# Patient Record
Sex: Female | Born: 1950
Health system: Southern US, Community
[De-identification: ages and names within clinical notes are randomized; demographics above are authoritative.]

## PROBLEM LIST (undated history)

## (undated) DIAGNOSIS — E079 Disorder of thyroid, unspecified: Secondary | ICD-10-CM

## (undated) DIAGNOSIS — M8580 Other specified disorders of bone density and structure, unspecified site: Secondary | ICD-10-CM

## (undated) DIAGNOSIS — E039 Hypothyroidism, unspecified: Secondary | ICD-10-CM

## (undated) DIAGNOSIS — K5790 Diverticulosis of intestine, part unspecified, without perforation or abscess without bleeding: Secondary | ICD-10-CM

## (undated) DIAGNOSIS — T7840XA Allergy, unspecified, initial encounter: Secondary | ICD-10-CM

## (undated) DIAGNOSIS — Q438 Other specified congenital malformations of intestine: Secondary | ICD-10-CM

## (undated) DIAGNOSIS — I471 Supraventricular tachycardia, unspecified: Secondary | ICD-10-CM

## (undated) DIAGNOSIS — K760 Fatty (change of) liver, not elsewhere classified: Secondary | ICD-10-CM

## (undated) HISTORY — DX: Allergy, unspecified, initial encounter: T78.40XA

## (undated) HISTORY — DX: Disorder of thyroid, unspecified: E07.9

## (undated) HISTORY — PX: TUBAL LIGATION: SHX77

## (undated) HISTORY — PX: COLONOSCOPY: SHX174

---

## 2004-10-12 ENCOUNTER — Ambulatory Visit: Payer: Self-pay | Admitting: Unknown Physician Specialty

## 2005-04-27 ENCOUNTER — Ambulatory Visit: Payer: Self-pay | Admitting: Unknown Physician Specialty

## 2005-08-27 ENCOUNTER — Ambulatory Visit: Payer: Self-pay | Admitting: Gastroenterology

## 2005-10-18 ENCOUNTER — Ambulatory Visit: Payer: Self-pay | Admitting: Unknown Physician Specialty

## 2006-02-18 ENCOUNTER — Ambulatory Visit: Payer: Self-pay | Admitting: Unknown Physician Specialty

## 2006-10-21 ENCOUNTER — Ambulatory Visit: Payer: Self-pay | Admitting: Unknown Physician Specialty

## 2007-10-23 ENCOUNTER — Ambulatory Visit: Payer: Self-pay | Admitting: Unknown Physician Specialty

## 2008-10-24 ENCOUNTER — Ambulatory Visit: Payer: Self-pay | Admitting: Unknown Physician Specialty

## 2009-10-28 ENCOUNTER — Ambulatory Visit: Payer: Self-pay | Admitting: Unknown Physician Specialty

## 2010-03-13 ENCOUNTER — Ambulatory Visit: Payer: Self-pay

## 2010-06-22 ENCOUNTER — Ambulatory Visit: Payer: Self-pay | Admitting: General Practice

## 2010-12-29 ENCOUNTER — Ambulatory Visit: Payer: Self-pay | Admitting: Unknown Physician Specialty

## 2011-06-10 LAB — HM PAP SMEAR: HM Pap smear: NORMAL

## 2011-11-08 LAB — HM MAMMOGRAPHY: HM Mammogram: NORMAL

## 2012-01-11 ENCOUNTER — Ambulatory Visit: Payer: Self-pay | Admitting: Unknown Physician Specialty

## 2012-02-07 ENCOUNTER — Encounter: Payer: Self-pay | Admitting: Internal Medicine

## 2012-02-07 ENCOUNTER — Ambulatory Visit (INDEPENDENT_AMBULATORY_CARE_PROVIDER_SITE_OTHER): Payer: BC Managed Care – PPO | Admitting: Internal Medicine

## 2012-02-07 VITALS — BP 108/68 | HR 74 | Temp 97.9°F | Resp 16 | Ht 68.0 in | Wt 185.0 lb

## 2012-02-07 DIAGNOSIS — R0982 Postnasal drip: Secondary | ICD-10-CM | POA: Insufficient documentation

## 2012-02-07 DIAGNOSIS — R5381 Other malaise: Secondary | ICD-10-CM

## 2012-02-07 DIAGNOSIS — R5383 Other fatigue: Secondary | ICD-10-CM

## 2012-02-07 LAB — TSH: TSH: 4.06 u[IU]/mL (ref 0.35–5.50)

## 2012-02-07 LAB — T4, FREE: Free T4: 0.84 ng/dL (ref 0.60–1.60)

## 2012-02-07 MED ORDER — IPRATROPIUM BROMIDE 0.03 % NA SOLN: 2.0000 | Freq: Two times a day (BID) | NASAL | Status: DC

## 2012-02-07 NOTE — Progress Notes (Signed)
Patient ID: Joanne Taylor, female   DOB: [birth year], 61 y.o.   MRN: [id number] Patient Active Problem List  Diagnosis  . Post-nasal drip    Subjective:  CC:   Chief Complaint  Patient presents with  . New Patient    HPI:   Joanne Taylor is a 61 y.o. female who presents as a new patient to establish primary care with the chief complaint of need to establish care.  Referred by mother Romero Belling.  Was seeing Junious Dresser Kincius and Francia Greaves, .  History of torn meniscus left knee s/p Hooten repair 2011. No history of abnormal PAPs .  Last one Nov 2012 done every 2 ro 4 years, mammogrma normal in May [facility].  Sinus surgery, Erline Hau 2007.   Current cc is sinus drainage  And PND causes periodic pharyngitis and cough , worse at night.  cyclic recurrence. No allergic symptoms.  Right side recurrently a problem.  No abx since January .  No purulent drainage.  currently no pain.  Menopause in early 21s.Marland Kitchen  DEXA scan last 2 years.  Exercises for 30 minutes in pool daly .    History reviewed. No pertinent past medical history.  Past Surgical History  Procedure Date  . Tubal ligation     Family History  Problem Relation Age of Onset  . Hypertension Mother   . Mental illness Mother     depressive disorder  . Cancer Father     prostate  . Mental illness Father     lewy body Dementia  . Hyperlipidemia Father     History   Social History  . Marital Status: Married    Spouse Name: N/A    Number of Children: N/A  . Years of Education: N/A   Occupational History  . Asst Dean     at [employer]   Social History Main Topics  . Smoking status: Former Smoker    Types: Cigarettes    Quit date: 02/07/1972  . Smokeless tobacco: Never Used  . Alcohol Use: 2.5 oz/week    5 drink(s) per week  . Drug Use: No  . Sexually Active: Not on file   Other Topics Concern  . Not on file   Social History Narrative  . No narrative on file         @ALLHX @    Review of Systems:   The  remainder of the review of systems was negative except those addressed in the HPI.       Objective:  BP 108/68  Pulse 74  Temp 97.9 F (36.6 C) (Oral)  Resp 16  Ht 5\' 8"  (1.727 m)  Wt 185 lb (83.915 kg)  BMI 28.13 kg/m2  SpO2 97%  General appearance: alert, cooperative and appears stated age Ears: normal TM's and external ear canals both ears Throat: lips, mucosa, and tongue normal; teeth and gums normal Neck: no adenopathy, no carotid bruit, supple, symmetrical, trachea midline and thyroid not enlarged, symmetric, no tenderness/mass/nodules Back: symmetric, no curvature. ROM normal. No CVA tenderness. Lungs: clear to auscultation bilaterally Heart: regular rate and rhythm, S1, S2 normal, no murmur, click, rub or gallop Abdomen: soft, non-tender; bowel sounds normal; no masses,  no organomegaly Pulses: 2+ and symmetric Skin: Skin color, texture, turgor normal. No rashes or lesions Lymph nodes: Cervical, supraclavicular, and axillary nodes normal.  Assessment and Plan:  Post-nasal drip No evidence of sinusitis on exam or pharyngitis.  Recommended Simply saline bid,  Atrovent nasal spray to dry secretions.  Updated Medication List Outpatient Encounter Prescriptions as of 02/07/2012  Medication Sig Dispense Refill  . Calcium Carb-Cholecalciferol (CALCIUM 1000 + D) 1000-800 MG-UNIT TABS Take by mouth.      . estrogen, conjugated,-medroxyprogesterone (PREMPRO) 0.3-1.5 MG per tablet Take 1 tablet by mouth daily.      Marland Kitchen ipratropium (ATROVENT) 0.03 % nasal spray Place 2 sprays into the nose every 12 (twelve) hours.  30 mL  12  . Multiple Vitamin (MULTIVITAMIN) tablet Take 1 tablet by mouth daily.      . Probiotic Product (PROBIOTIC FORMULA PO) Take by mouth.

## 2012-02-07 NOTE — Patient Instructions (Addendum)
Try using the Atrovent twice daily for the drainage  Use  1) Afrin 2) simply saline  3) atrovent in that order for a few days,  Then stop the afrin

## 2012-02-07 NOTE — Assessment & Plan Note (Signed)
No evidence of sinusitis on exam or pharyngitis.  Recommended Simply saline bid,  Atrovent nasal spray to dry secretions.

## 2012-02-12 ENCOUNTER — Encounter: Payer: Self-pay | Admitting: Internal Medicine

## 2012-05-05 ENCOUNTER — Other Ambulatory Visit: Payer: Self-pay | Admitting: Internal Medicine

## 2012-05-05 MED ORDER — CONJ ESTROG-MEDROXYPROGEST ACE 0.3-1.5 MG PO TABS: 1.0000 | ORAL_TABLET | Freq: Every day | ORAL | Status: DC

## 2012-06-15 ENCOUNTER — Telehealth: Payer: Self-pay | Admitting: Internal Medicine

## 2012-06-15 NOTE — Telephone Encounter (Signed)
° °   Please call patient and schedule appointment. We have updated the department for the pool to receive schedule request.       Thank you   MyChart Support      ----- Message -----   From: Amaryllis Weng   Sent: 06/13/2012 9:42 PM   To: Mychart Admin   Subject: Appointment Request       Appointment Request From: Joanne Taylor      With Provider: Duncan Dull, MD [-Primary Care Physician-]      Preferred Date Range: From 06/13/2012 To 08/02/2012      Preferred Times: Any      Reason for visit: Annual Physical      Comments:   I have an appt. for a physical on Dec. 30 but I would like to try and reschedule if possible. Early mornings or late afternoons are preferable but I will take any available time if I don't have a conflict. My work number is [phone].          Spoke with pt and told her if we r/s the next appointment would be in march.  Pt stated she would keep her dec appointment

## 2012-08-07 ENCOUNTER — Encounter: Payer: Self-pay | Admitting: Internal Medicine

## 2012-08-07 ENCOUNTER — Ambulatory Visit (INDEPENDENT_AMBULATORY_CARE_PROVIDER_SITE_OTHER): Payer: BC Managed Care – PPO | Admitting: Internal Medicine

## 2012-08-07 VITALS — BP 116/74 | HR 72 | Temp 97.7°F | Resp 12 | Ht 68.0 in | Wt 183.0 lb

## 2012-08-07 DIAGNOSIS — Z0001 Encounter for general adult medical examination with abnormal findings: Secondary | ICD-10-CM | POA: Insufficient documentation

## 2012-08-07 DIAGNOSIS — R635 Abnormal weight gain: Secondary | ICD-10-CM

## 2012-08-07 DIAGNOSIS — R5381 Other malaise: Secondary | ICD-10-CM

## 2012-08-07 DIAGNOSIS — R5383 Other fatigue: Secondary | ICD-10-CM

## 2012-08-07 DIAGNOSIS — E663 Overweight: Secondary | ICD-10-CM | POA: Insufficient documentation

## 2012-08-07 DIAGNOSIS — Z124 Encounter for screening for malignant neoplasm of cervix: Secondary | ICD-10-CM

## 2012-08-07 DIAGNOSIS — Z0000 Encounter for general adult medical examination without abnormal findings: Secondary | ICD-10-CM | POA: Insufficient documentation

## 2012-08-07 NOTE — Progress Notes (Signed)
Patient ID: Joanne Taylor, female   DOB: 02-22-1951, 61 y.o.   MRN: [id number]   Subjective:     Joanne Taylor is a 61 y.o. female and is here for a comprehensive physical exam. The patient reports no problems.  History   Social History  . Marital Status: Married    Spouse Name: N/A    Number of Children: N/A  . Years of Education: N/A   Occupational History  . Asst Dean     at [employer]   Social History Main Topics  . Smoking status: Former Smoker    Types: Cigarettes    Quit date: 02/07/1972  . Smokeless tobacco: Never Used  . Alcohol Use: 2.5 oz/week    5 drink(s) per week  . Drug Use: No  . Sexually Active: Not on file   Other Topics Concern  . Not on file   Social History Narrative  . No narrative on file   Health Maintenance  Topic Date Due  . Tetanus/tdap  03/07/1969  . Zostavax  03/07/2010  . Influenza Vaccine  04/09/2012  . Mammogram  11/07/2013  . Pap Smear  06/09/2014  . Colonoscopy  02/07/2019    The following portions of the patient's history were reviewed and updated as appropriate: allergies, current medications, past family history, past medical history, past social history, past surgical history and problem list.  Review of Systems A comprehensive review of systems was negative.   Objective:        Assessment:  BP 116/74  Pulse 72  Temp 97.7 F (36.5 C) (Oral)  Resp 12  Ht 5\' 8"  (1.727 m)  Wt 183 lb (83.008 kg)  BMI 27.82 kg/m2  SpO2 99%  General Appearance:    Alert, cooperative, no distress, appears stated age  Head:    Normocephalic, without obvious abnormality, atraumatic  Eyes:    PERRL, conjunctiva/corneas clear, EOM's intact, fundi    benign, both eyes  Ears:    Normal TM's and external ear canals, both ears  Nose:   Nares normal, septum midline, mucosa normal, no drainage    or sinus tenderness  Throat:   Lips, mucosa, and tongue normal; teeth and gums normal  Neck:   Supple, symmetrical, trachea midline, no adenopathy;    thyroid:  no  enlargement/tenderness/nodules; no carotid   bruit or JVD  Back:     Symmetric, no curvature, ROM normal, no CVA tenderness  Lungs:     Clear to auscultation bilaterally, respirations unlabored  Chest Wall:    No tenderness or deformity   Heart:    Regular rate and rhythm, S1 and S2 normal, no murmur, rub   or gallop  Breast Exam:    No tenderness, masses, or nipple abnormality  Abdomen:     Soft, non-tender, bowel sounds active all four quadrants,    no masses, no organomegaly  Genitalia:    Normal external exam without lesion. Vaginal exam without  discharge or tenderness cervix was friable   Extremities:   Extremities normal, atraumatic, no cyanosis or edema  Pulses:   2+ and symmetric all extremities  Skin:   Skin color, texture, turgor normal, no rashes or lesions  Lymph nodes:   Cervical, supraclavicular, and axillary nodes normal  Neurologic:   CNII-XII intact, normal strength, sensation and reflexes    throughout    Assessment and Plan  Routine general medical examination at a health care facility Pelvic and pap smear were done today  Cervix was friable,  Had  intercourse 2 days ago .  I have recommended that she return in 3 months  for repeat pelvic   Weight gain I have recommended a low glycemic index diet utilizing smaller more frequent meals to increase metabolism.  I have also recommended that patient start exercising with a goal of 30 minutes of aerobic exercise a minimum of 5 days per week. Screening for lipid disorders, thyroid and diabetes to be done today.     Updated Medication List Outpatient Encounter Prescriptions as of 08/07/2012  Medication Sig Dispense Refill  . Calcium Carb-Cholecalciferol (CALCIUM 1000 + D) 1000-800 MG-UNIT TABS Take by mouth.      . estrogen, conjugated,-medroxyprogesterone (PREMPRO) 0.3-1.5 MG per tablet Take 1 tablet by mouth daily.  30 tablet  3  . Probiotic Product (PROBIOTIC FORMULA PO) Take by mouth.      . [DISCONTINUED]  ipratropium (ATROVENT) 0.03 % nasal spray Place 2 sprays into the nose every 12 (twelve) hours.  30 mL  12  . Multiple Vitamin (MULTIVITAMIN) tablet Take 1 tablet by mouth daily.

## 2012-08-07 NOTE — Assessment & Plan Note (Signed)
Pelvic and pap smear were done today  Cervix was friable,  Had intercourse 2 days ago .  I have recommended that she return in 3 months  for repeat pelvic

## 2012-08-07 NOTE — Patient Instructions (Addendum)
return for fasting lipids  at your convenience   we should repeat your pelvic exam in s few months.   This is  my version of a  "Low GI"  Diet:  Most of the foods can be found at grocery stores and in bulk at [employer].  The Atkins protein bars and shakes are available in more varieties at [employer], [employer] and [employer].     7 AM Breakfast:  Low carbohydrate Protein  Shakes (I recommend the EAS AdvantEdge "Carb Control" shakes  Or the low carb shakes by Atkins.   Both are available everywhere:  In  cases at [employer]Js  Or in 4 packs at grocery stores and pharmacies  2.5 carbs  (Alternative is  a toasted Arnold's Sandwhich Thin w/ peanut butter, a "Bagel Thin" with cream cheese and salmon) or  a scrambled egg burrito made with a low carb tortilla .  Avoid cereal and bananas, oatmeal too unless you are cooking the old fashioned kind that takes 30-40 minutes to prepare.  the rest is overly processed, has minimal fiber, and is loaded with carbohydrates!   10 AM: Protein bar by Atkins (the snack size, under 200 cal).  There are many varieties , available widely again or in bulk in limited varieties at BJs)  Other so called "protein bars" tend to be loaded with carbohydrates.  Remember, in food advertising, the word "energy" is synonymous for " carbohydrate."  Lunch: sandwich of [place], (or any lunchmeat, grilled meat or canned tuna), fresh avocado, mayonnaise  and cheese on a lower carbohydrate pita bread, flatbread, or tortilla . Ok to use regular mayonnaise. The bread is the only source or carbohydrate that can be decreased (Joseph's makes a pita bread and a flat bread that are 50 cal and 4 net carbs ; Toufayan makes a low carb flatbread that's 100 cal and 9 net carbs  and  Mission makes a low carb whole wheat tortilla  That is 210 cal and 6 net carbs)  3 PM:  Mid day :  Another protein bar,  Or a  cheese stick (100 cal, 0 carbs),  Or 1 ounce of  almonds, walnuts, pistachios, pecans, peanuts,  Macadamia nuts.  Or a Dannon light n Fit greek yogurt, 80 cal 8 net carbs . Avoid "granola"; the dried cranberries and raisins are loaded with carbohydrates. Mixed nuts ok if no raisins or cranberries or dried fruit.      6 PM  Dinner:  "mean and green:"  Meat/chicken/fish or a high protein legume; , with a green salad, and a low GI  Veggie (broccoli, cauliflower, green beans, spinach, brussel sprouts. Lima beans) : Avoid "Low fat dressings, as well as Reyne Dumas and [address]! They are loaded with sugar! Instead use ranch, vinagrette,  Blue cheese, etc.  There is a low carb pasta by Dreamfield's available at [employer] [facility] that is acceptable and tastes great. Try Michel Angel's chicken piccata over low carb pasta. The chicken dish is 0 carbs, and can be found in frozen section at BJs and [employer]'s. Also try [employer] "Carnitas" (pulled pork, no sauce,  0 carbs) and his pot roast.   both are in the refrigerated section at BJs .  And try the Dreamfiled's pasta availabel at all local grocery stores,  5 carbs/serving  9 PM snack : Breyer's "low carb" fudgsicle or  ice cream bar (Carb Smart line), or  Weight Watcher's ice cream bar , or another "no sugar added" ice  cream;a serving of fresh berries/cherries with whipped cream (Avoid bananas, pineapple, grapes  and watermelon on a regular basis because they are high in sugar)   Remember that snack Substitutions should be less than 15 to 20 carbs  Per serving. Remember to subtract fiber grams and sugar alcohols to get the "net carbs."

## 2012-08-07 NOTE — Assessment & Plan Note (Signed)
I have recommended a low glycemic index diet utilizing smaller more frequent meals to increase metabolism.  I have also recommended that patient start exercising with a goal of 30 minutes of aerobic exercise a minimum of 5 days per week. Screening for lipid disorders, thyroid and diabetes to be done today.   

## 2012-08-08 ENCOUNTER — Other Ambulatory Visit (INDEPENDENT_AMBULATORY_CARE_PROVIDER_SITE_OTHER): Payer: BC Managed Care – PPO

## 2012-08-08 DIAGNOSIS — R5383 Other fatigue: Secondary | ICD-10-CM

## 2012-08-08 DIAGNOSIS — R5381 Other malaise: Secondary | ICD-10-CM

## 2012-08-08 DIAGNOSIS — R635 Abnormal weight gain: Secondary | ICD-10-CM

## 2012-08-08 LAB — TSH: TSH: 3.04 u[IU]/mL (ref 0.35–5.50)

## 2012-08-08 LAB — CBC WITH DIFFERENTIAL/PLATELET
Basophils Absolute: 0 10*3/uL (ref 0.0–0.1)
Eosinophils Absolute: 0.1 10*3/uL (ref 0.0–0.7)
Lymphocytes Relative: 29.4 % (ref 12.0–46.0)
MCHC: 33.5 g/dL (ref 30.0–36.0)
Neutrophils Relative %: 60.8 % (ref 43.0–77.0)
Platelets: 240 10*3/uL (ref 150.0–400.0)
RDW: 13.7 % (ref 11.5–14.6)

## 2012-08-08 LAB — COMPREHENSIVE METABOLIC PANEL
ALT: 18 U/L (ref 0–35)
AST: 23 U/L (ref 0–37)
Albumin: 4 g/dL (ref 3.5–5.2)
Calcium: 8.9 mg/dL (ref 8.4–10.5)
Chloride: 105 mEq/L (ref 96–112)
Creatinine, Ser: 0.8 mg/dL (ref 0.4–1.2)
Potassium: 4.5 mEq/L (ref 3.5–5.1)

## 2012-08-08 LAB — LIPID PANEL
HDL: 77.7 mg/dL (ref >39.00)
LDL Cholesterol: 88 mg/dL (ref 0–99)
Total CHOL/HDL Ratio: 2

## 2012-08-16 ENCOUNTER — Encounter: Payer: Self-pay | Admitting: Internal Medicine

## 2012-08-28 ENCOUNTER — Other Ambulatory Visit: Payer: Self-pay | Admitting: Internal Medicine

## 2012-08-28 NOTE — Telephone Encounter (Signed)
Med filled.  

## 2012-10-06 ENCOUNTER — Ambulatory Visit: Payer: Self-pay | Admitting: Family Medicine

## 2012-10-20 ENCOUNTER — Encounter: Payer: Self-pay | Admitting: Internal Medicine

## 2012-10-20 ENCOUNTER — Ambulatory Visit (INDEPENDENT_AMBULATORY_CARE_PROVIDER_SITE_OTHER): Payer: BC Managed Care – PPO | Admitting: Internal Medicine

## 2012-10-20 ENCOUNTER — Other Ambulatory Visit (HOSPITAL_COMMUNITY)
Admission: RE | Admit: 2012-10-20 | Discharge: 2012-10-20 | Disposition: A | Discharge status: Home / Self Care | Payer: BC Managed Care – PPO | Source: Ambulatory Visit | Attending: Internal Medicine | Admitting: Internal Medicine

## 2012-10-20 VITALS — BP 118/66 | HR 73 | Temp 98.1°F | Resp 16 | Wt 186.0 lb

## 2012-10-20 DIAGNOSIS — Z1151 Encounter for screening for human papillomavirus (HPV): Secondary | ICD-10-CM | POA: Insufficient documentation

## 2012-10-20 DIAGNOSIS — Z124 Encounter for screening for malignant neoplasm of cervix: Secondary | ICD-10-CM

## 2012-10-20 DIAGNOSIS — Z01419 Encounter for gynecological examination (general) (routine) without abnormal findings: Secondary | ICD-10-CM | POA: Insufficient documentation

## 2012-10-20 DIAGNOSIS — J069 Acute upper respiratory infection, unspecified: Secondary | ICD-10-CM

## 2012-10-20 MED ORDER — LEVOFLOXACIN 500 MG PO TABS: 500.0000 mg | ORAL_TABLET | Freq: Every day | ORAL | Status: DC

## 2012-10-20 MED ORDER — CONJ ESTROG-MEDROXYPROGEST ACE 0.3-1.5 MG PO TABS: ORAL_TABLET | ORAL | Status: DC

## 2012-10-20 NOTE — Patient Instructions (Addendum)
You have a viral  Syndrome which is affecting your sinuses and ears .  The post nasal drip is causing your cough Lavage your sinuses twice daily with Simply saline nasal spray. You can Use benadryl 25 mg every 8 hours or the Atrovent nasal spray I hgave you last year for the drippig You can use  Sudafed PE 10 to 30 mg  every 8 hours to manage the congestion.  If your symptoms escalate to include fever (T > 100.4) ,  Or you develop Facial or ear pain,  Start the levaquin for 7 days.    Will notify you of the results of your PAP smear next week.

## 2012-10-20 NOTE — Assessment & Plan Note (Addendum)
Cervix is still friable,  But she just finished antiobitoics. If PAP semar is normal and no candidae seen we discussed referral to gyn Dr Janene Harvey

## 2012-10-20 NOTE — Progress Notes (Signed)
Patient ID: Joanne Taylor, female   DOB: January 12, 1951, 62 y.o.   MRN: [id number]  Patient Active Problem List  Diagnosis  . Routine general medical examination at a health care facility  . Weight gain  . Screening for cervical cancer  . URI (upper respiratory infection)    Subjective:  CC:   Chief Complaint  Patient presents with  . Pelvic exam    HPI:   Joanne Taylor a 62 y.o. female who presents for a Pelvic exam with PAP which was postponed at her annual exam in December since she had intercourse the night before which resulted in an irritated appearing cervix.   She was treated 2 weeks ago for sinus infection  with augmentin, flonase  And claritin.  She has resolution of sinus pain but continues to have congestion.     History reviewed. No pertinent past medical history.  Past Surgical History  Procedure Laterality Date  . Tubal ligation         The following portions of the patient's history were reviewed and updated as appropriate: Allergies, current medications, and problem list.    Review of Systems:   Patient denies headache, fevers, malaise, unintentional weight loss, skin rash, eye pain, sinus congestion and sinus pain, sore throat, dysphagia,  hemoptysis , cough, dyspnea, wheezing, chest pain, palpitations, orthopnea, edema, abdominal pain, nausea, melena, diarrhea, constipation, flank pain, dysuria, hematuria, urinary  Frequency, nocturia, numbness, tingling, seizures,  Focal weakness, Loss of consciousness,  Tremor, insomnia, depression, anxiety, and suicidal ideation.     History   Social History  . Marital Status: Married    Spouse Name: N/A    Number of Children: N/A  . Years of Education: N/A   Occupational History  . Asst Dean     at [employer]   Social History Main Topics  . Smoking status: Former Smoker    Types: Cigarettes    Quit date: 02/07/1972  . Smokeless tobacco: Never Used  . Alcohol Use: 2.5 oz/week    5 drink(s) per week  . Drug  Use: No  . Sexually Active: Not on file   Other Topics Concern  . Not on file   Social History Narrative  . No narrative on file    Objective:  BP 118/66  Pulse 73  Temp(Src) 98.1 F (36.7 C) (Oral)  Resp 16  Wt 186 lb (84.369 kg)  BMI 28.29 kg/m2  SpO2 97%  General Appearance:    Alert, cooperative, no distress, appears stated age  Head:    Normocephalic, without obvious abnormality, atraumatic  Eyes:    PERRL, conjunctiva/corneas clear, EOM's intact, fundi    benign, both eyes  Ears:    Normal TM's and external ear canals, both ears  Nose:   Nares normal, septum midline, mucosa normal, no drainage    or sinus tenderness  Throat:   Lips, mucosa, and tongue normal; teeth and gums normal  Neck:   Supple, symmetrical, trachea midline, no adenopathy;    thyroid:  no enlargement/tenderness/nodules; no carotid   bruit or JVD  Back:     Symmetric, no curvature, ROM normal, no CVA tenderness  Lungs:     Clear to auscultation bilaterally, respirations unlabored  Chest Wall:    No tenderness or deformity   Heart:    Regular rate and rhythm, S1 and S2 normal, no murmur, rub   or gallop  Abdomen:     Soft, non-tender, bowel sounds active all four quadrants,    no masses,  no organomegaly  Genitalia:    Pelvic: cervix friable and erythematous  in appearance, external genitalia normal, no adnexal masses or tenderness, no cervical motion tenderness, rectovaginal septum normal, uterus normal size, shape, and consistency and vagina normal without discharge  Extremities:   Extremities normal, atraumatic, no cyanosis or edema  Pulses:   2+ and symmetric all extremities  Skin:   Skin color, texture, turgor normal, no rashes or lesions  Lymph nodes:   Cervical, supraclavicular, and axillary nodes normal  Neurologic:   CNII-XII intact, normal strength, sensation and reflexes    throughout    Assessment and Plan:  Screening for cervical cancer Cervix is still friable,  But she just  finished antiobitoics. If PAP semar is normal and no candidae seen we discussed referral to gyn Dr Janene Harvey  URI (upper respiratory infection) Symptom are persistent due to viral and inflammation .  Symptomatic treatment recommended.    Updated Medication List Outpatient Encounter Prescriptions as of 10/20/2012  Medication Sig Dispense Refill  . Calcium Carb-Cholecalciferol (CALCIUM 1000 + D) 1000-800 MG-UNIT TABS Take by mouth.      . estrogen, conjugated,-medroxyprogesterone (PREMPRO) 0.3-1.5 MG per tablet TAKE 1 TABLET BY MOUTH ONCE A DAY  30 tablet  11  . Multiple Vitamin (MULTIVITAMIN) tablet Take 1 tablet by mouth daily.      . Probiotic Product (PROBIOTIC FORMULA PO) Take by mouth.      . [DISCONTINUED] PREMPRO 0.3-1.5 MG per tablet TAKE 1 TABLET BY MOUTH ONCE A DAY  28 tablet  2  . levofloxacin (LEVAQUIN) 500 MG tablet Take 1 tablet (500 mg total) by mouth daily.  7 tablet  0   No facility-administered encounter medications on file as of 10/20/2012.     No orders of the defined types were placed in this encounter.    Return in about 1 year (around 10/20/2013).

## 2012-10-21 ENCOUNTER — Encounter: Payer: Self-pay | Admitting: Internal Medicine

## 2012-10-21 DIAGNOSIS — J069 Acute upper respiratory infection, unspecified: Secondary | ICD-10-CM | POA: Insufficient documentation

## 2012-10-21 NOTE — Assessment & Plan Note (Signed)
Symptom are persistent due to viral and inflammation .  Symptomatic treatment recommended.

## 2012-10-24 ENCOUNTER — Encounter: Payer: Self-pay | Admitting: Internal Medicine

## 2012-11-01 LAB — HM PAP SMEAR: HM Pap smear: NORMAL

## 2012-12-25 ENCOUNTER — Telehealth: Payer: Self-pay | Admitting: Internal Medicine

## 2012-12-25 NOTE — Telephone Encounter (Signed)
See below phone note  Appointment Request From: Joanne Taylor With Provider: Duncan Dull, MD [-Primary Care Physician-]  Preferred Date Range: Any date 12/23/2012 or later Preferred Times: Monday Morning, Tuesday Morning, Wednesday Morning, Thursday Morning, Friday Morning Reason: To address the following health maintenance concerns. Mammogram Comments: I would like to schedule a mammogram in June. A few weeks ago I had some tenderness in my right breast. It resolved itself in about 7-10 days - I have had a similar event in the past. I prefer a 7:15 appt. if I go to [place].  Does she need an office visit prior to ordering mammogram

## 2012-12-26 NOTE — Telephone Encounter (Signed)
No , not unless she feels a lump. Because that will change the odrer to a diagnostic.

## 2012-12-26 NOTE — Telephone Encounter (Signed)
Left message, advising of Dr. Melina Schools recommendation. Advised she may call [facility] and schedule her own screening mammogram at her convenience. Advised to call back if she has felt a lump so that we may schedule an office visit to evaluate.

## 2013-01-23 ENCOUNTER — Ambulatory Visit: Payer: Self-pay | Admitting: Internal Medicine

## 2013-01-25 ENCOUNTER — Ambulatory Visit: Payer: Self-pay | Admitting: Internal Medicine

## 2013-02-01 LAB — HM MAMMOGRAPHY: HM Mammogram: NORMAL

## 2013-05-11 ENCOUNTER — Telehealth: Payer: Self-pay | Admitting: Internal Medicine

## 2013-05-11 NOTE — Telephone Encounter (Signed)
My chart message  Can I get my shingles shot at the pharmacy? [facility]

## 2013-05-11 NOTE — Telephone Encounter (Signed)
Yes, they can as long as they have had chicken pox.

## 2013-05-14 MED ORDER — TETANUS-DIPHTH-ACELL PERTUSSIS 5-2.5-18.5 LF-MCG/0.5 IM SUSP: 0.5000 mL | Freq: Once | INTRAMUSCULAR | Status: DC

## 2013-05-14 NOTE — Telephone Encounter (Signed)
Can patient get shingles vaccine script sent to [facility]?

## 2013-05-14 NOTE — Telephone Encounter (Signed)
Do they need a rx sent to [facility]

## 2013-05-14 NOTE — Telephone Encounter (Signed)
rx sent

## 2013-06-14 ENCOUNTER — Other Ambulatory Visit: Payer: Self-pay

## 2013-07-04 ENCOUNTER — Encounter: Payer: Self-pay | Admitting: Internal Medicine

## 2013-10-02 ENCOUNTER — Encounter: Payer: Self-pay | Admitting: Internal Medicine

## 2013-10-02 ENCOUNTER — Other Ambulatory Visit: Payer: Self-pay | Admitting: Internal Medicine

## 2013-10-30 ENCOUNTER — Encounter: Payer: BC Managed Care – PPO | Admitting: Internal Medicine

## 2013-11-01 ENCOUNTER — Ambulatory Visit (INDEPENDENT_AMBULATORY_CARE_PROVIDER_SITE_OTHER): Payer: BC Managed Care – PPO | Admitting: Internal Medicine

## 2013-11-01 ENCOUNTER — Encounter: Payer: Self-pay | Admitting: Internal Medicine

## 2013-11-01 VITALS — BP 138/84 | HR 64 | Temp 97.8°F | Resp 16 | Ht 68.0 in | Wt 184.2 lb

## 2013-11-01 DIAGNOSIS — R5381 Other malaise: Secondary | ICD-10-CM

## 2013-11-01 DIAGNOSIS — Z23 Encounter for immunization: Secondary | ICD-10-CM

## 2013-11-01 DIAGNOSIS — E559 Vitamin D deficiency, unspecified: Secondary | ICD-10-CM

## 2013-11-01 DIAGNOSIS — R5383 Other fatigue: Secondary | ICD-10-CM

## 2013-11-01 DIAGNOSIS — Z0000 Encounter for general adult medical examination without abnormal findings: Secondary | ICD-10-CM

## 2013-11-01 DIAGNOSIS — Z113 Encounter for screening for infections with a predominantly sexual mode of transmission: Secondary | ICD-10-CM

## 2013-11-01 DIAGNOSIS — E785 Hyperlipidemia, unspecified: Secondary | ICD-10-CM

## 2013-11-01 DIAGNOSIS — Z2911 Encounter for prophylactic immunotherapy for respiratory syncytial virus (RSV): Secondary | ICD-10-CM

## 2013-11-01 LAB — CBC WITH DIFFERENTIAL/PLATELET
BASOS ABS: 0 10*3/uL (ref 0.0–0.1)
Basophils Relative: 0.4 % (ref 0.0–3.0)
EOS ABS: 0.1 10*3/uL (ref 0.0–0.7)
EOS PCT: 1 % (ref 0.0–5.0)
HCT: 42.2 % (ref 36.0–46.0)
Hemoglobin: 14.1 g/dL (ref 12.0–15.0)
LYMPHS ABS: 1.7 10*3/uL (ref 0.7–4.0)
Lymphocytes Relative: 24.8 % (ref 12.0–46.0)
MCHC: 33.4 g/dL (ref 30.0–36.0)
MCV: 90.6 fl (ref 78.0–100.0)
MONO ABS: 0.4 10*3/uL (ref 0.1–1.0)
Monocytes Relative: 6.2 % (ref 3.0–12.0)
Neutro Abs: 4.7 10*3/uL (ref 1.4–7.7)
Neutrophils Relative %: 67.6 % (ref 43.0–77.0)
PLATELETS: 254 10*3/uL (ref 150.0–400.0)
RBC: 4.65 Mil/uL (ref 3.87–5.11)
RDW: 13.6 % (ref 11.5–14.6)
WBC: 7 10*3/uL (ref 4.5–10.5)

## 2013-11-01 LAB — TSH: TSH: 4.87 u[IU]/mL (ref 0.35–5.50)

## 2013-11-01 LAB — COMPREHENSIVE METABOLIC PANEL
ALT: 18 U/L (ref 0–35)
AST: 20 U/L (ref 0–37)
Albumin: 4.4 g/dL (ref 3.5–5.2)
Alkaline Phosphatase: 74 U/L (ref 39–117)
BUN: 18 mg/dL (ref 6–23)
CALCIUM: 9.7 mg/dL (ref 8.4–10.5)
CO2: 28 meq/L (ref 19–32)
Chloride: 104 mEq/L (ref 96–112)
Creatinine, Ser: 0.8 mg/dL (ref 0.4–1.2)
GFR: 74.68 mL/min (ref >60.00)
GLUCOSE: 99 mg/dL (ref 70–99)
Potassium: 4.2 mEq/L (ref 3.5–5.1)
Sodium: 139 mEq/L (ref 135–145)
TOTAL PROTEIN: 6.8 g/dL (ref 6.0–8.3)
Total Bilirubin: 0.8 mg/dL (ref 0.3–1.2)

## 2013-11-01 MED ORDER — CONJ ESTROG-MEDROXYPROGEST ACE 0.3-1.5 MG PO TABS: ORAL_TABLET | ORAL | Status: DC

## 2013-11-01 NOTE — Progress Notes (Signed)
Pre-visit discussion using our clinic review tool. No additional management support is needed unless otherwise documented below in the visit note.  

## 2013-11-01 NOTE — Assessment & Plan Note (Signed)
Annual comprehensive exam was done including breast, pelvic. PAP smear normal March 2014.   All screenings have been addressed .

## 2013-11-01 NOTE — Patient Instructions (Signed)
You had your annual wellness exam today  We will schedule your mammogram  In June  You are due for your 5 yr follow up .   You need to have a TDaP vaccine and a Shingles vaccine.  You received the Shingles  vaccine today.  We will contact you with the bloodwork results

## 2013-11-02 LAB — HEPATITIS C ANTIBODY: HCV AB: NEGATIVE

## 2013-11-02 LAB — VITAMIN D 25 HYDROXY (VIT D DEFICIENCY, FRACTURES): VIT D 25 HYDROXY: 68 ng/mL (ref 30–89)

## 2013-11-02 LAB — HIV ANTIBODY (ROUTINE TESTING W REFLEX): HIV: NONREACTIVE

## 2013-11-04 ENCOUNTER — Encounter: Payer: Self-pay | Admitting: Internal Medicine

## 2013-11-04 NOTE — Progress Notes (Signed)
Patient ID: Joanne Taylor, female   DOB: 1950-10-12, 63 y.o.   MRN: [id number]  Subjective:     Joanne Taylor is a 63 y.o. female and is here for a comprehensive physical exam. The patient reports no problems.  History   Social History  . Marital Status: Married    Spouse Name: N/A    Number of Children: N/A  . Years of Education: N/A   Occupational History  . Asst Dean     at [employer] History Main Topics  . Smoking status: Former Smoker    Types: Cigarettes    Quit date: 02/07/1972  . Smokeless tobacco: Never Used  . Alcohol Use: 2.5 oz/week    5 drink(s) per week  . Drug Use: No  . Sexual Activity: Not on file   Other Topics Concern  . Not on file   Social History Narrative  . No narrative on file   Health Maintenance  Topic Date Due  . Tetanus/tdap  03/07/1969  . Influenza Vaccine  03/09/2014  . Mammogram  02/02/2015  . Pap Smear  11/02/2015  . Colonoscopy  02/07/2019  . Zostavax  Completed    The following portions of the patient's history were reviewed and updated as appropriate: allergies, current medications, past family history, past medical history, past social history, past surgical history and problem list.  Review of Systems A comprehensive review of systems was negative.   Objective:   BP 138/84  Pulse 64  Temp(Src) 97.8 F (36.6 C) (Oral)  Resp 16  Ht 5\' 8"  (1.727 m)  Wt 184 lb 4 oz (83.575 kg)  BMI 28.02 kg/m2  SpO2 98%  General appearance: alert, cooperative and appears stated age Ears: normal TM's and external ear canals both ears Throat: lips, mucosa, and tongue normal; teeth and gums normal Neck: no adenopathy, no carotid bruit, supple, symmetrical, trachea midline and thyroid not enlarged, symmetric, no tenderness/mass/nodules Back: symmetric, no curvature. ROM normal. No CVA tenderness. Lungs: clear to auscultation bilaterally Heart: regular rate and rhythm, S1, S2 normal, no murmur, click, rub or  gallop Abdomen: soft, non-tender; bowel sounds normal; no masses,  no organomegaly Pulses: 2+ and symmetric Skin: Skin color, texture, turgor normal. No rashes or lesions Lymph nodes: Cervical, supraclavicular, and axillary nodes normal.   Assessment and Plan:    Routine general medical examination at a health care facility Annual comprehensive exam was done including breast, pelvic. PAP smear normal March 2014.   All screenings have been addressed .    Updated Medication List Outpatient Encounter Prescriptions as of 11/01/2013  Medication Sig  . Calcium Carb-Cholecalciferol (CALCIUM 1000 + D) 1000-800 MG-UNIT TABS Take by mouth.  . estrogen, conjugated,-medroxyprogesterone (PREMPRO) 0.3-1.5 MG per tablet TAKE 1 TABLET BY MOUTH ONCE A DAY  . Multiple Vitamin (MULTIVITAMIN) tablet Take 1 tablet by mouth daily.  . Probiotic Product (PROBIOTIC FORMULA PO) Take by mouth.  . [DISCONTINUED] PREMPRO 0.3-1.5 MG per tablet TAKE 1 TABLET BY MOUTH ONCE A DAY  . TDaP (BOOSTRIX) 5-2.5-18.5 LF-MCG/0.5 injection Inject 0.5 mLs into the muscle once.  . [DISCONTINUED] levofloxacin (LEVAQUIN) 500 MG tablet Take 1 tablet (500 mg total) by mouth daily.

## 2014-02-20 ENCOUNTER — Ambulatory Visit: Payer: Self-pay | Admitting: Internal Medicine

## 2014-02-20 ENCOUNTER — Encounter: Payer: Self-pay | Admitting: *Deleted

## 2014-02-20 ENCOUNTER — Other Ambulatory Visit: Payer: Self-pay | Admitting: Internal Medicine

## 2014-02-20 LAB — HM MAMMOGRAPHY: HM Mammogram: NEGATIVE

## 2014-05-17 ENCOUNTER — Other Ambulatory Visit: Payer: Self-pay | Admitting: Internal Medicine

## 2014-07-11 ENCOUNTER — Other Ambulatory Visit: Payer: Self-pay | Admitting: Internal Medicine

## 2014-07-11 NOTE — Telephone Encounter (Signed)
Last pap 10/2012.  Last CPE 10/2013.  Please advise refill

## 2014-07-11 NOTE — Telephone Encounter (Signed)
Ok to refill,  Refill sent  

## 2014-10-07 ENCOUNTER — Other Ambulatory Visit: Payer: Self-pay | Admitting: Internal Medicine

## 2014-11-06 ENCOUNTER — Encounter: Payer: Self-pay | Admitting: Internal Medicine

## 2014-11-06 ENCOUNTER — Ambulatory Visit (INDEPENDENT_AMBULATORY_CARE_PROVIDER_SITE_OTHER): Payer: BC Managed Care – PPO | Admitting: Internal Medicine

## 2014-11-06 VITALS — BP 126/86 | HR 73 | Temp 97.6°F | Resp 16 | Ht 68.5 in | Wt 195.8 lb

## 2014-11-06 DIAGNOSIS — Z1239 Encounter for other screening for malignant neoplasm of breast: Secondary | ICD-10-CM | POA: Diagnosis not present

## 2014-11-06 DIAGNOSIS — Z0000 Encounter for general adult medical examination without abnormal findings: Secondary | ICD-10-CM

## 2014-11-06 DIAGNOSIS — E663 Overweight: Secondary | ICD-10-CM

## 2014-11-06 DIAGNOSIS — E782 Mixed hyperlipidemia: Secondary | ICD-10-CM | POA: Diagnosis not present

## 2014-11-06 DIAGNOSIS — R5383 Other fatigue: Secondary | ICD-10-CM | POA: Diagnosis not present

## 2014-11-06 LAB — LIPID PANEL
CHOL/HDL RATIO: 2
CHOLESTEROL: 192 mg/dL (ref 0–200)
HDL: 82.2 mg/dL (ref >39.00)
LDL Cholesterol: 86 mg/dL (ref 0–99)
NonHDL: 109.8
Triglycerides: 118 mg/dL (ref 0.0–149.0)
VLDL: 23.6 mg/dL (ref 0.0–40.0)

## 2014-11-06 LAB — TSH: TSH: 4.22 u[IU]/mL (ref 0.35–4.50)

## 2014-11-06 MED ORDER — CONJ ESTROG-MEDROXYPROGEST ACE 0.3-1.5 MG PO TABS: 1.0000 | ORAL_TABLET | Freq: Every day | ORAL | Status: DC

## 2014-11-06 NOTE — Patient Instructions (Signed)
We will repeat your DEXA next year  Along with your PAP smear   3 D mammogram to be scheduled early AM after July 15   I recommend getting the majority of your calcium and Vitamin D  through diet rather than supplements given the recent association of calcium supplements with increased coronary artery calcium scores  Try the Silk almond Light,  Original formula.  Delicious,  Low carb,  Low cal,  Cholesterol free  Health Maintenance Adopting a healthy lifestyle and getting preventive care can go a long way to promote health and wellness. Talk with your health care provider about what schedule of regular examinations is right for you. This is a good chance for you to check in with your provider about disease prevention and staying healthy. In between checkups, there are plenty of things you can do on your own. Experts have done a lot of research about which lifestyle changes and preventive measures are most likely to keep you healthy. Ask your health care provider for more information. WEIGHT AND DIET  Eat a healthy diet  Be sure to include plenty of vegetables, fruits, low-fat dairy products, and lean protein.  Do not eat a lot of foods high in solid fats, added sugars, or salt.  Get regular exercise. This is one of the most important things you can do for your health.  Most adults should exercise for at least 150 minutes each week. The exercise should increase your heart rate and make you sweat (moderate-intensity exercise).  Most adults should also do strengthening exercises at least twice a week. This is in addition to the moderate-intensity exercise.  Maintain a healthy weight  Body mass index (BMI) is a measurement that can be used to identify possible weight problems. It estimates body fat based on height and weight. Your health care provider can help determine your BMI and help you achieve or maintain a healthy weight.  For females 65 years of age and older:   A BMI below 18.5  is considered underweight.  A BMI of 18.5 to 24.9 is normal.  A BMI of 25 to 29.9 is considered overweight.  A BMI of 30 and above is considered obese.  Watch levels of cholesterol and blood lipids  You should start having your blood tested for lipids and cholesterol at 65 years of age, then have this test every 5 years.  You may need to have your cholesterol levels checked more often if:  Your lipid or cholesterol levels are high.  You are older than 65 years of age.  You are at high risk for heart disease.  CANCER SCREENING   Lung Cancer  Lung cancer screening is recommended for adults 65-7 years old who are at high risk for lung cancer because of a history of smoking.  A yearly low-dose CT scan of the lungs is recommended for people who:  Currently smoke.  Have quit within the past 15 years.  Have at least a 30-pack-year history of smoking. A pack year is smoking an average of one pack of cigarettes a day for 1 year.  Yearly screening should continue until it has been 15 years since you quit.  Yearly screening should stop if you develop a health problem that would prevent you from having lung cancer treatment.  Breast Cancer  Practice breast self-awareness. This means understanding how your breasts normally appear and feel.  It also means doing regular breast self-exams. Let your health care provider know about any changes, no  matter how small.  If you are in your 65s or 30s, you should have a clinical breast exam (CBE) by a health care provider every 1-3 years as part of a regular health exam.  If you are 65 or older, have a CBE every year. Also consider having a breast X-ray (mammogram) every year.  If you have a family history of breast cancer, talk to your health care provider about genetic screening.  If you are at high risk for breast cancer, talk to your health care provider about having an MRI and a mammogram every year.  Breast cancer gene (BRCA)  assessment is recommended for women who have family members with BRCA-related cancers. BRCA-related cancers include:  Breast.  Ovarian.  Tubal.  Peritoneal cancers.  Results of the assessment will determine the need for genetic counseling and BRCA1 and BRCA2 testing. Cervical Cancer Routine pelvic examinations to screen for cervical cancer are no longer recommended for nonpregnant women who are considered low risk for cancer of the pelvic organs (ovaries, uterus, and vagina) and who do not have symptoms. A pelvic examination may be necessary if you have symptoms including those associated with pelvic infections. Ask your health care provider if a screening pelvic exam is right for you.   The Pap test is the screening test for cervical cancer for women who are considered at risk.  If you had a hysterectomy for a problem that was not cancer or a condition that could lead to cancer, then you no longer need Pap tests.  If you are older than 65 years, and you have had normal Pap tests for the past 10 years, you no longer need to have Pap tests.  If you have had past treatment for cervical cancer or a condition that could lead to cancer, you need Pap tests and screening for cancer for at least 20 years after your treatment.  If you no longer get a Pap test, assess your risk factors if they change (such as having a new sexual partner). This can affect whether you should start being screened again.  Some women have medical problems that increase their chance of getting cervical cancer. If this is the case for you, your health care provider may recommend more frequent screening and Pap tests.  The human papillomavirus (HPV) test is another test that may be used for cervical cancer screening. The HPV test looks for the virus that can cause cell changes in the cervix. The cells collected during the Pap test can be tested for HPV.  The HPV test can be used to screen women 65 years of age and older.  Getting tested for HPV can extend the interval between normal Pap tests from three to five years.  An HPV test also should be used to screen women of any age who have unclear Pap test results.  After 65 years of age, women should have HPV testing as often as Pap tests.  Colorectal Cancer  This type of cancer can be detected and often prevented.  Routine colorectal cancer screening usually begins at 65 years of age and continues through 65 years of age.  Your health care provider may recommend screening at an earlier age if you have risk factors for colon cancer.  Your health care provider may also recommend using home test kits to check for hidden blood in the stool.  A small camera at the end of a tube can be used to examine your colon directly (sigmoidoscopy or colonoscopy). This is  done to check for the earliest forms of colorectal cancer.  Routine screening usually begins at age 50.  Direct examination of the colon should be repeated every 5-10 years through 65 years of age. However, you may need to be screened more often if early forms of precancerous polyps or small growths are found. Skin Cancer  Check your skin from head to toe regularly.  Tell your health care provider about any new moles or changes in moles, especially if there is a change in a mole's shape or color.  Also tell your health care provider if you have a mole that is larger than the size of a pencil eraser.  Always use sunscreen. Apply sunscreen liberally and repeatedly throughout the day.  Protect yourself by wearing long sleeves, pants, a wide-brimmed hat, and sunglasses whenever you are outside. HEART DISEASE, DIABETES, AND HIGH BLOOD PRESSURE   Have your blood pressure checked at least every 1-2 years. High blood pressure causes heart disease and increases the risk of stroke.  If you are between 55 years and 79 years old, ask your health care provider if you should take aspirin to prevent  strokes.  Have regular diabetes screenings. This involves taking a blood sample to check your fasting blood sugar level.  If you are at a normal weight and have a low risk for diabetes, have this test once every three years after 65 years of age.  If you are overweight and have a high risk for diabetes, consider being tested at a younger age or more often. PREVENTING INFECTION  Hepatitis B  If you have a higher risk for hepatitis B, you should be screened for this virus. You are considered at high risk for hepatitis B if:  You were born in a country where hepatitis B is common. Ask your health care provider which countries are considered high risk.  Your parents were born in a high-risk country, and you have not been immunized against hepatitis B (hepatitis B vaccine).  You have HIV or AIDS.  You use needles to inject street drugs.  You live with someone who has hepatitis B.  You have had sex with someone who has hepatitis B.  You get hemodialysis treatment.  You take certain medicines for conditions, including cancer, organ transplantation, and autoimmune conditions. Hepatitis C  Blood testing is recommended for:  Everyone born from 1945 through 1965.  Anyone with known risk factors for hepatitis C. Sexually transmitted infections (STIs)  You should be screened for sexually transmitted infections (STIs) including gonorrhea and chlamydia if:  You are sexually active and are younger than 65 years of age.  You are older than 65 years of age and your health care provider tells you that you are at risk for this type of infection.  Your sexual activity has changed since you were last screened and you are at an increased risk for chlamydia or gonorrhea. Ask your health care provider if you are at risk.  If you do not have HIV, but are at risk, it may be recommended that you take a prescription medicine daily to prevent HIV infection. This is called pre-exposure prophylaxis  (PrEP). You are considered at risk if:  You are sexually active and do not regularly use condoms or know the HIV status of your partner(s).  You take drugs by injection.  You are sexually active with a partner who has HIV. Talk with your health care provider about whether you are at high risk of being infected   with HIV. If you choose to begin PrEP, you should first be tested for HIV. You should then be tested every 3 months for as long as you are taking PrEP.  PREGNANCY   If you are premenopausal and you may become pregnant, ask your health care provider about preconception counseling.  If you may become pregnant, take 400 to 800 micrograms (mcg) of folic acid every day.  If you want to prevent pregnancy, talk to your health care provider about birth control (contraception). OSTEOPOROSIS AND MENOPAUSE   Osteoporosis is a disease in which the bones lose minerals and strength with aging. This can result in serious bone fractures. Your risk for osteoporosis can be identified using a bone density scan.  If you are 37 years of age or older, or if you are at risk for osteoporosis and fractures, ask your health care provider if you should be screened.  Ask your health care provider whether you should take a calcium or vitamin D supplement to lower your risk for osteoporosis.  Menopause may have certain physical symptoms and risks.  Hormone replacement therapy may reduce some of these symptoms and risks. Talk to your health care provider about whether hormone replacement therapy is right for you.  HOME CARE INSTRUCTIONS   Schedule regular health, dental, and eye exams.  Stay current with your immunizations.   Do not use any tobacco products including cigarettes, chewing tobacco, or electronic cigarettes.  If you are pregnant, do not drink alcohol.  If you are breastfeeding, limit how much and how often you drink alcohol.  Limit alcohol intake to no more than 1 drink per day for  nonpregnant women. One drink equals 12 ounces of beer, 5 ounces of wine, or 1 ounces of hard liquor.  Do not use street drugs.  Do not share needles.  Ask your health care provider for help if you need support or information about quitting drugs.  Tell your health care provider if you often feel depressed.  Tell your health care provider if you have ever been abused or do not feel safe at home. Document Released: 02/08/2011 Document Revised: 12/10/2013 Document Reviewed: 06/27/2013 [facility] Patient Information 2015 [place], [place]. This information is not intended to replace advice given to you by your health care provider. Make sure you discuss any questions you have with your health care provider.

## 2014-11-06 NOTE — Progress Notes (Signed)
Patient ID: Joanne Taylor, female   DOB: 03/20/1951, 64 y.o.   MRN: [id number]   Subjective:     Joanne Taylor is a 64 y.o. female and is here for a comprehensive physical exam. The patient reports no problems.  11 lb weight gain Body mass index is 29.33 kg/(m^2).  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   Husband had rotator cuff surgery recently and she has had decreased time for personal care.  .   Monday night was first time she had the night to herself,     History   Social History  . Marital Status: Married    Spouse Name: N/A  . Number of Children: N/A  . Years of Education: N/A   Occupational History  . Asst Dean     at [employer] History Main Topics  . Smoking status: Former Smoker    Types: Cigarettes    Quit date: 02/07/1972  . Smokeless tobacco: Never Used  . Alcohol Use: 2.5 oz/week    5 drink(s) per week  . Drug Use: No  . Sexual Activity: Not on file   Other Topics Concern  . Not on file   Social History Narrative   Health Maintenance  Topic Date Due  . INFLUENZA VACCINE  03/10/2015  . MAMMOGRAM  02/21/2016  . COLONOSCOPY  02/07/2019  . TETANUS/TDAP  02/08/2024  . ZOSTAVAX  Completed  . HIV Screening  Completed    The following portions of the patient's history were reviewed and updated as appropriate: allergies, current medications, past family history, past medical history, past social history, past surgical history and problem list.  Review of Systems  Patient denies headache, fevers, malaise, unintentional weight loss, skin rash, eye pain, sinus congestion and sinus pain, sore throat, dysphagia,  hemoptysis , cough, dyspnea, wheezing, chest pain, palpitations, orthopnea, edema, abdominal pain, nausea, melena, diarrhea, constipation, flank pain, dysuria, hematuria, urinary  Frequency, nocturia, numbness, tingling, seizures,  Focal weakness, Loss of consciousness,  Tremor,  insomnia, depression, anxiety, and suicidal ideation.     Objective:   BP 126/86 mmHg  Pulse 73  Temp(Src) 97.6 F (36.4 C) (Oral)  Resp 16  Ht 5' 8.5" (1.74 m)  Wt 195 lb 12 oz (88.792 kg)  BMI 29.33 kg/m2  SpO2 97%  General appearance: alert, cooperative and appears stated age Head: Normocephalic, without obvious abnormality, atraumatic Eyes: conjunctivae/corneas clear. PERRL, EOM's intact. Fundi benign. Ears: normal TM's and external ear canals both ears Nose: Nares normal. Septum midline. Mucosa normal. No drainage or sinus tenderness. Throat: lips, mucosa, and tongue normal; teeth and gums normal Neck: no adenopathy, no carotid bruit, no JVD, supple, symmetrical, trachea midline and thyroid not enlarged, symmetric, no tenderness/mass/nodules Lungs: clear to auscultation bilaterally Breasts: normal appearance, no masses or tenderness Heart: regular rate and rhythm, S1, S2 normal, no murmur, click, rub or gallop Abdomen: soft, non-tender; bowel sounds normal; no masses,  no organomegaly Extremities: extremities normal, atraumatic, no cyanosis or edema Pulses: 2+ and symmetric Skin: Skin color, texture, turgor normal. No rashes or lesions Neurologic: Alert and oriented X 3, normal strength and tone. Normal symmetric reflexes. Normal coordination and gait.    Assessment and Plan:   Problem List Items Addressed This Visit      Unprioritized   Routine general medical examination at a health care facility    Annual wellness  exam was done as well  as a comprehensive physical exam and management of acute and chronic conditions .  During the course of the visit the patient was educated and counseled about appropriate screening and preventive services including :  diabetes screening, lipid analysis with projected  10 year  risk for CAD , nutrition counseling, colorectal cancer screening, and recommended immunizations.  Printed recommendations for health maintenance screenings was  given.        Overweight (BMI 25.0-29.9)    I have addressed  BMI and recommended a low glycemic index diet utilizing smaller more frequent meals to increase metabolism.  I have also recommended that patient start exercising with a goal of 30 minutes of aerobic exercise a minimum of 5 days per week. Screening for lipid disorders, thyroid and diabetes to be done today.  Lab Results  Component Value Date   TSH 4.22 11/06/2014   Lab Results  Component Value Date   CHOL 192 11/06/2014   HDL 82.20 11/06/2014   LDLCALC 86 11/06/2014   TRIG 118.0 11/06/2014   CHOLHDL 2 11/06/2014           Other Visit Diagnoses    Breast cancer screening    -  Primary    Relevant Orders    MM DIGITAL SCREENING BILATERAL    Hyperlipemia, mixed        Relevant Orders    Lipid panel (Completed)    Other fatigue        Relevant Orders    Comprehensive metabolic panel (Completed)    TSH (Completed)

## 2014-11-07 LAB — COMPREHENSIVE METABOLIC PANEL
ALBUMIN: 4.2 g/dL (ref 3.5–5.2)
ALK PHOS: 79 U/L (ref 39–117)
ALT: 15 U/L (ref 0–35)
AST: 20 U/L (ref 0–37)
BUN: 15 mg/dL (ref 6–23)
CO2: 24 mEq/L (ref 19–32)
Calcium: 9.2 mg/dL (ref 8.4–10.5)
Chloride: 103 mEq/L (ref 96–112)
Creatinine, Ser: 0.95 mg/dL (ref 0.40–1.20)
GFR: 62.81 mL/min (ref >60.00)
GLUCOSE: 90 mg/dL (ref 70–99)
POTASSIUM: 4.7 meq/L (ref 3.5–5.1)
Sodium: 137 mEq/L (ref 135–145)
TOTAL PROTEIN: 6.8 g/dL (ref 6.0–8.3)
Total Bilirubin: 0.5 mg/dL (ref 0.2–1.2)

## 2014-11-09 ENCOUNTER — Encounter: Payer: Self-pay | Admitting: Internal Medicine

## 2014-11-09 NOTE — Assessment & Plan Note (Signed)

## 2014-11-09 NOTE — Assessment & Plan Note (Signed)
I have addressed  BMI and recommended a low glycemic index diet utilizing smaller more frequent meals to increase metabolism.  I have also recommended that patient start exercising with a goal of 30 minutes of aerobic exercise a minimum of 5 days per week. Screening for lipid disorders, thyroid and diabetes to be done today.  Lab Results  Component Value Date   TSH 4.22 11/06/2014   Lab Results  Component Value Date   CHOL 192 11/06/2014   HDL 82.20 11/06/2014   LDLCALC 86 11/06/2014   TRIG 118.0 11/06/2014   CHOLHDL 2 11/06/2014

## 2014-11-10 ENCOUNTER — Encounter: Payer: Self-pay | Admitting: Internal Medicine

## 2015-02-25 ENCOUNTER — Ambulatory Visit
Admission: RE | Admit: 2015-02-25 | Discharge: 2015-02-25 | Disposition: A | Discharge status: Home / Self Care | Payer: BC Managed Care – PPO | Source: Ambulatory Visit | Attending: Internal Medicine | Admitting: Internal Medicine

## 2015-02-25 DIAGNOSIS — Z1239 Encounter for other screening for malignant neoplasm of breast: Secondary | ICD-10-CM

## 2015-02-27 ENCOUNTER — Ambulatory Visit
Admission: RE | Admit: 2015-02-27 | Discharge: 2015-02-27 | Disposition: A | Discharge status: Home / Self Care | Payer: BC Managed Care – PPO | Source: Ambulatory Visit | Attending: Internal Medicine | Admitting: Internal Medicine

## 2015-02-27 ENCOUNTER — Other Ambulatory Visit: Payer: Self-pay | Admitting: Internal Medicine

## 2015-02-27 DIAGNOSIS — Z1231 Encounter for screening mammogram for malignant neoplasm of breast: Secondary | ICD-10-CM | POA: Insufficient documentation

## 2015-02-27 DIAGNOSIS — Z1239 Encounter for other screening for malignant neoplasm of breast: Secondary | ICD-10-CM

## 2015-05-28 ENCOUNTER — Encounter: Payer: Self-pay | Admitting: Internal Medicine

## 2015-05-28 ENCOUNTER — Ambulatory Visit (INDEPENDENT_AMBULATORY_CARE_PROVIDER_SITE_OTHER): Payer: BC Managed Care – PPO | Admitting: Internal Medicine

## 2015-05-28 VITALS — BP 130/82 | HR 66 | Temp 97.7°F | Resp 12 | Ht 68.5 in | Wt 195.1 lb

## 2015-05-28 DIAGNOSIS — Z0000 Encounter for general adult medical examination without abnormal findings: Secondary | ICD-10-CM | POA: Diagnosis not present

## 2015-05-28 DIAGNOSIS — M8580 Other specified disorders of bone density and structure, unspecified site: Secondary | ICD-10-CM

## 2015-05-28 DIAGNOSIS — L821 Other seborrheic keratosis: Secondary | ICD-10-CM

## 2015-05-28 DIAGNOSIS — Z23 Encounter for immunization: Secondary | ICD-10-CM

## 2015-05-28 NOTE — Patient Instructions (Signed)
Seborrheic Keratosis Seborrheic keratosis is a common, noncancerous (benign) skin growth. This condition causes waxy, rough, tan, brown, or black spots to appear on the skin. These skin growths can be flat or raised. CAUSES The cause of this condition is not known. RISK FACTORS This condition is more likely to develop in:  People who have a family history of seborrheic keratosis.  People who are 50 or older.  People who are pregnant.  People who have had estrogen replacement therapy. SYMPTOMS This condition often occurs on the face, chest, shoulders, back, or other areas. These growths:  Are usually painless, but may become irritated and itchy.  Can be yellow, brown, black, or other colors.  Are slightly raised or have a flat surface.  Are sometimes rough or wart-like in texture.  Are often waxy on the surface.  Are round or oval-shaped.  Sometimes look like they are "stuck on."  Often occur in groups, but may occur as a single growth. DIAGNOSIS This condition is diagnosed with a medical history and physical exam. A sample of the growth may be tested (skin biopsy). You may need to see a skin specialist (dermatologist). TREATMENT Treatment is not usually needed for this condition, unless the growths are irritated or are often bleeding. You may also choose to have the growths removed if you do not like their appearance. Most commonly, these growths are treated with a procedure in which liquid nitrogen is applied to "freeze" off the growth (cryosurgery). They may also be burned off with electricity or cut off. HOME CARE INSTRUCTIONS  Watch your growth for any changes.  Keep all follow-up visits as told by your health care provider. This is important.  Do not scratch or pick at the growth or growths. This can cause them to become irritated or infected. SEEK MEDICAL CARE IF:  You suddenly have many new growths.  Your growth bleeds, itches, or hurts.  Your growth suddenly  becomes larger or changes color.   This information is not intended to replace advice given to you by your health care provider. Make sure you discuss any questions you have with your health care provider.   Document Released: 08/28/2010 Document Revised: 04/16/2015 Document Reviewed: 12/11/2014 [employer] Interactive Patient Education 2016 [employer]  

## 2015-05-28 NOTE — Progress Notes (Signed)
Subjective:  Patient ID: Joanne Taylor, female    DOB: [birth year]  Age: 64 y.o. MRN: [id number]  CC: The primary encounter diagnosis was Osteopenia. Diagnoses of Need for prophylactic vaccination against Streptococcus pneumoniae (pneumococcus) and Seborrheic keratosis were also pertinent to this visit.  HPI Joanne Taylor presents for evaluation of a skin growth on her back .  Not sure how long it has been present , but began itching and bothering her because of its location under her bra strap. Marland Kitchen   Health Maintenance issues:  DEXA needed  Prevnar needed   Outpatient Prescriptions Prior to Visit  Medication Sig Dispense Refill  . Calcium Carb-Cholecalciferol (CALCIUM 1000 + D) 1000-800 MG-UNIT TABS Take by mouth.    . estrogen, conjugated,-medroxyprogesterone (PREMPRO) 0.3-1.5 MG per tablet Take 1 tablet by mouth daily. 28 tablet 11  . Multiple Vitamin (MULTIVITAMIN) tablet Take 1 tablet by mouth daily.    . Probiotic Product (PROBIOTIC FORMULA PO) Take by mouth.    . TDaP (BOOSTRIX) 5-2.5-18.5 LF-MCG/0.5 injection Inject 0.5 mLs into the muscle once. 0.5 mL 0   No facility-administered medications prior to visit.    Review of Systems;  Patient denies headache, fevers, malaise, unintentional weight loss, skin rash, eye pain, sinus congestion and sinus pain, sore throat, dysphagia,  hemoptysis , cough, dyspnea, wheezing, chest pain, palpitations, orthopnea, edema, abdominal pain, nausea, melena, diarrhea, constipation, flank pain, dysuria, hematuria, urinary  Frequency, nocturia, numbness, tingling, seizures,  Focal weakness, Loss of consciousness,  Tremor, insomnia, depression, anxiety, and suicidal ideation.      Objective:  BP 130/82 mmHg  Pulse 66  Temp(Src) 97.7 F (36.5 C) (Oral)  Resp 12  Ht 5' 8.5" (1.74 m)  Wt 195 lb 2 oz (88.508 kg)  BMI 29.23 kg/m2  SpO2 98%  BP Readings from Last 3 Encounters:  05/28/15 130/82  11/06/14 126/86  11/01/13  138/84    Wt Readings from Last 3 Encounters:  05/28/15 195 lb 2 oz (88.508 kg)  11/06/14 195 lb 12 oz (88.792 kg)  11/01/13 184 lb 4 oz (83.575 kg)    General appearance: alert, cooperative and appears stated age Back: symmetric, no curvature. ROM normal. No CVA tenderness. Lungs: clear to auscultation bilaterally Heart: regular rate and rhythm, S1, S2 normal, no murmur, click, rub or gallop Abdomen: soft, non-tender; bowel sounds normal; no masses,  no organomegaly Pulses: 2+ and symmetric Skin: hyperpigmented papular lesion with stuck-on appearance c/w seborrheic keratosis  Lymph nodes: Cervical, supraclavicular, and axillary nodes normal.  No results found for: HGBA1C  Lab Results  Component Value Date   CREATININE 0.95 11/06/2014   CREATININE 0.8 11/01/2013   CREATININE 0.8 08/08/2012    Lab Results  Component Value Date   WBC 7.0 11/01/2013   HGB 14.1 11/01/2013   HCT 42.2 11/01/2013   PLT 254.0 11/01/2013   GLUCOSE 90 11/06/2014   CHOL 192 11/06/2014   TRIG 118.0 11/06/2014   HDL 82.20 11/06/2014   LDLCALC 86 11/06/2014   ALT 15 11/06/2014   AST 20 11/06/2014   NA 137 11/06/2014   K 4.7 11/06/2014   CL 103 11/06/2014   CREATININE 0.95 11/06/2014   BUN 15 11/06/2014   CO2 24 11/06/2014   TSH 4.22 11/06/2014    Mm Screening Breast Tomo Bilateral  02/27/2015  CLINICAL DATA:  Screening. EXAM: DIGITAL SCREENING BILATERAL MAMMOGRAM WITH 3D TOMO WITH CAD COMPARISON:  Previous exam(s). ACR Breast Density Category b: There are scattered areas of fibroglandular density.  FINDINGS: There are no findings suspicious for malignancy. Images were processed with CAD. IMPRESSION: No mammographic evidence of malignancy. A result letter of this screening mammogram will be mailed directly to the patient. RECOMMENDATION: Screening mammogram in one year. (Code:[id number]-B-01Y) BI-RADS CATEGORY  1: Negative. Electronically Signed   By: Abelardo Diesel M.D.   On: 02/27/2015 10:50     Assessment & Plan:   Problem List Items Addressed This Visit    Seborrheic keratosis    On back.  Reassurance provided,.       Osteopenia - Primary    DEXA needed . Discussed the current controversies surrounding the risks and benefits of calcium supplementation.  Encouraged her to increase dietary calcium through natural foods including almond/coconut milk      Relevant Orders   DG Bone Density    Other Visit Diagnoses    Need for prophylactic vaccination against Streptococcus pneumoniae (pneumococcus)        Relevant Orders    Pneumococcal conjugate vaccine 13-valent (Completed)       I have discontinued Joanne Taylor's Tdap. I am also having her maintain her multivitamin, Calcium Carb-Cholecalciferol, Probiotic Product (PROBIOTIC FORMULA PO), and estrogen (conjugated)-medroxyprogesterone.  No orders of the defined types were placed in this encounter.   A total of 15 minutes of face to face time was spent with patient more than half of which was spent in counselling and coordination of care   Medications Discontinued During This Encounter  Medication Reason  . TDaP (BOOSTRIX) 5-2.5-18.5 LF-MCG/0.5 injection Error    Follow-up: No Follow-up on file.   Crecencio Mc, MD

## 2015-05-28 NOTE — Progress Notes (Signed)
Pre-visit discussion using our clinic review tool. No additional management support is needed unless otherwise documented below in the visit note.  

## 2015-05-29 DIAGNOSIS — M8580 Other specified disorders of bone density and structure, unspecified site: Secondary | ICD-10-CM | POA: Insufficient documentation

## 2015-05-29 DIAGNOSIS — L821 Other seborrheic keratosis: Secondary | ICD-10-CM | POA: Insufficient documentation

## 2015-05-29 NOTE — Assessment & Plan Note (Signed)
DEX A needed . Discussed the current controversies surrounding the risks and benefits of calcium supplementation.  Encouraged her to increase dietary calcium through natural foods including almond/coconut milk 

## 2015-05-29 NOTE — Assessment & Plan Note (Signed)
I have addressed  BMI and recommended wt loss of 10% of body weight over the next 6 months using a low glycemic index diet and regular exercise a minimum of 5 days per week.   Wt Readings from Last 3 Encounters:  05/28/15 195 lb 2 oz (88.508 kg)  11/06/14 195 lb 12 oz (88.792 kg)  11/01/13 184 lb 4 oz (83.575 kg)

## 2015-05-29 NOTE — Assessment & Plan Note (Signed)
On back.  Reassurance provided,.

## 2015-06-11 ENCOUNTER — Ambulatory Visit
Admission: RE | Admit: 2015-06-11 | Discharge: 2015-06-11 | Disposition: A | Discharge status: Home / Self Care | Payer: BC Managed Care – PPO | Source: Ambulatory Visit | Attending: Internal Medicine | Admitting: Internal Medicine

## 2015-06-11 DIAGNOSIS — M8580 Other specified disorders of bone density and structure, unspecified site: Secondary | ICD-10-CM | POA: Diagnosis not present

## 2015-06-11 DIAGNOSIS — Z780 Asymptomatic menopausal state: Secondary | ICD-10-CM | POA: Diagnosis not present

## 2015-06-12 ENCOUNTER — Encounter: Payer: Self-pay | Admitting: Internal Medicine

## 2015-09-27 ENCOUNTER — Emergency Department: Payer: BC Managed Care – PPO

## 2015-09-27 ENCOUNTER — Encounter: Payer: Self-pay | Admitting: Emergency Medicine

## 2015-09-27 ENCOUNTER — Emergency Department
Admission: EM | Admit: 2015-09-27 | Discharge: 2015-09-27 | Disposition: A | Discharge status: Home / Self Care | Payer: BC Managed Care – PPO | Attending: Emergency Medicine | Admitting: Emergency Medicine

## 2015-09-27 DIAGNOSIS — Z87891 Personal history of nicotine dependence: Secondary | ICD-10-CM | POA: Diagnosis not present

## 2015-09-27 DIAGNOSIS — R079 Chest pain, unspecified: Secondary | ICD-10-CM | POA: Diagnosis not present

## 2015-09-27 LAB — COMPREHENSIVE METABOLIC PANEL
ALBUMIN: 4.3 g/dL (ref 3.5–5.0)
ALT: 17 U/L (ref 14–54)
ANION GAP: 9 (ref 5–15)
AST: 22 U/L (ref 15–41)
Alkaline Phosphatase: 91 U/L (ref 38–126)
BUN: 13 mg/dL (ref 6–20)
CHLORIDE: 106 mmol/L (ref 101–111)
CO2: 25 mmol/L (ref 22–32)
Calcium: 9.6 mg/dL (ref 8.9–10.3)
Creatinine, Ser: 0.89 mg/dL (ref 0.44–1.00)
GFR calc Af Amer: >60 mL/min (ref >60)
GFR calc non Af Amer: >60 mL/min (ref >60)
Glucose, Bld: 106 mg/dL — ABNORMAL HIGH (ref 65–99)
POTASSIUM: 4.5 mmol/L (ref 3.5–5.1)
SODIUM: 140 mmol/L (ref 135–145)
Total Bilirubin: 0.9 mg/dL (ref 0.3–1.2)
Total Protein: 7.6 g/dL (ref 6.5–8.1)

## 2015-09-27 LAB — CBC WITH DIFFERENTIAL/PLATELET
Basophils Absolute: 0.1 10*3/uL (ref 0–0.1)
Basophils Relative: 1 %
EOS PCT: 1 %
Eosinophils Absolute: 0.1 10*3/uL (ref 0–0.7)
HEMATOCRIT: 43.9 % (ref 35.0–47.0)
Hemoglobin: 14.9 g/dL (ref 12.0–16.0)
LYMPHS ABS: 1.9 10*3/uL (ref 1.0–3.6)
LYMPHS PCT: 20 %
MCH: 29.6 pg (ref 26.0–34.0)
MCHC: 34 g/dL (ref 32.0–36.0)
MCV: 87.3 fL (ref 80.0–100.0)
MONO ABS: 0.6 10*3/uL (ref 0.2–0.9)
Monocytes Relative: 6 %
NEUTROS ABS: 6.7 10*3/uL — AB (ref 1.4–6.5)
Neutrophils Relative %: 72 %
PLATELETS: 254 10*3/uL (ref 150–440)
RBC: 5.02 MIL/uL (ref 3.80–5.20)
RDW: 13.6 % (ref 11.5–14.5)
WBC: 9.2 10*3/uL (ref 3.6–11.0)

## 2015-09-27 LAB — FIBRIN DERIVATIVES D-DIMER (ARMC ONLY): FIBRIN DERIVATIVES D-DIMER (ARMC): 253 (ref 0–499)

## 2015-09-27 LAB — TROPONIN I
Troponin I: <0.03 ng/mL (ref <0.031)
Troponin I: <0.03 ng/mL (ref <0.031)

## 2015-09-27 NOTE — Discharge Instructions (Signed)
Nonspecific Chest Pain  °Chest pain can be caused by many different conditions. There is always a chance that your pain could be related to something serious, such as a heart attack or a blood clot in your lungs. Chest pain can also be caused by conditions that are not life-threatening. If you have chest pain, it is very important to follow up with your health care provider. °CAUSES  °Chest pain can be caused by: °· Heartburn. °· Pneumonia or bronchitis. °· Anxiety or stress. °· Inflammation around your heart (pericarditis) or lung (pleuritis or pleurisy). °· A blood clot in your lung. °· A collapsed lung (pneumothorax). It can develop suddenly on its own (spontaneous pneumothorax) or from trauma to the chest. °· Shingles infection (varicella-zoster virus). °· Heart attack. °· Damage to the bones, muscles, and cartilage that make up your chest wall. This can include: °¨ Bruised bones due to injury. °¨ Strained muscles or cartilage due to frequent or repeated coughing or overwork. °¨ Fracture to one or more ribs. °¨ Sore cartilage due to inflammation (costochondritis). °RISK FACTORS  °Risk factors for chest pain may include: °· Activities that increase your risk for trauma or injury to your chest. °· Respiratory infections or conditions that cause frequent coughing. °· Medical conditions or overeating that can cause heartburn. °· Heart disease or family history of heart disease. °· Conditions or health behaviors that increase your risk of developing a blood clot. °· Having had chicken pox (varicella zoster). °SIGNS AND SYMPTOMS °Chest pain can feel like: °· Burning or tingling on the surface of your chest or deep in your chest. °· Crushing, pressure, aching, or squeezing pain. °· Dull or sharp pain that is worse when you move, cough, or take a deep breath. °· Pain that is also felt in your back, neck, shoulder, or arm, or pain that spreads to any of these areas. °Your chest pain may come and go, or it may stay  constant. °DIAGNOSIS °Lab tests or other studies may be needed to find the cause of your pain. Your health care provider may have you take a test called an ambulatory ECG (electrocardiogram). An ECG records your heartbeat patterns at the time the test is performed. You may also have other tests, such as: °· Transthoracic echocardiogram (TTE). During echocardiography, sound waves are used to create a picture of all of the heart structures and to look at how blood flows through your heart. °· Transesophageal echocardiogram (TEE). This is a more advanced imaging test that obtains images from inside your body. It allows your health care provider to see your heart in finer detail. °· Cardiac monitoring. This allows your health care provider to monitor your heart rate and rhythm in real time. °· Holter monitor. This is a portable device that records your heartbeat and can help to diagnose abnormal heartbeats. It allows your health care provider to track your heart activity for several days, if needed. °· Stress tests. These can be done through exercise or by taking medicine that makes your heart beat more quickly. °· Blood tests. °· Imaging tests. °TREATMENT  °Your treatment depends on what is causing your chest pain. Treatment may include: °· Medicines. These may include: °¨ Acid blockers for heartburn. °¨ Anti-inflammatory medicine. °¨ Pain medicine for inflammatory conditions. °¨ Antibiotic medicine, if an infection is present. °¨ Medicines to dissolve blood clots. °¨ Medicines to treat coronary artery disease. °· Supportive care for conditions that do not require medicines. This may include: °¨ Resting. °¨ Applying heat   or cold packs to injured areas. °¨ Limiting activities until pain decreases. °HOME CARE INSTRUCTIONS °· If you were prescribed an antibiotic medicine, finish it all even if you start to feel better. °· Avoid any activities that bring on chest pain. °· Do not use any tobacco products, including  cigarettes, chewing tobacco, or electronic cigarettes. If you need help quitting, ask your health care provider. °· Do not drink alcohol. °· Take medicines only as directed by your health care provider. °· Keep all follow-up visits as directed by your health care provider. This is important. This includes any further testing if your chest pain does not go away. °· If heartburn is the cause for your chest pain, you may be told to keep your head raised (elevated) while sleeping. This reduces the chance that acid will go from your stomach into your esophagus. °· Make lifestyle changes as directed by your health care provider. These may include: °¨ Getting regular exercise. Ask your health care provider to suggest some activities that are safe for you. °¨ Eating a heart-healthy diet. A registered dietitian can help you to learn healthy eating options. °¨ Maintaining a healthy weight. °¨ Managing diabetes, if necessary. °¨ Reducing stress. °SEEK MEDICAL CARE IF: °· Your chest pain does not go away after treatment. °· You have a rash with blisters on your chest. °· You have a fever. °SEEK IMMEDIATE MEDICAL CARE IF:  °· Your chest pain is worse. °· You have an increasing cough, or you cough up blood. °· You have severe abdominal pain. °· You have severe weakness. °· You faint. °· You have chills. °· You have sudden, unexplained chest discomfort. °· You have sudden, unexplained discomfort in your arms, back, neck, or jaw. °· You have shortness of breath at any time. °· You suddenly start to sweat, or your skin gets clammy. °· You feel nauseous or you vomit. °· You suddenly feel light-headed or dizzy. °· Your heart begins to beat quickly, or it feels like it is skipping beats. °These symptoms may represent a serious problem that is an emergency. Do not wait to see if the symptoms will go away. Get medical help right away. Call your local emergency services (911 in the [place] Do not drive yourself to the hospital. °  °This  information is not intended to replace advice given to you by your health care provider. Make sure you discuss any questions you have with your health care provider. °  °Document Released: 05/05/2005 Document Revised: 08/16/2014 Document Reviewed: 03/01/2014 °Elsevier Interactive Patient Education ©2016 [employer] ° °

## 2015-09-27 NOTE — ED Notes (Signed)
Pt states has tightness in chest that increases when she takes a deep breath.

## 2015-09-27 NOTE — ED Notes (Signed)
Chest pain since 4 am.  Worse with deep breathing.  No resp distress

## 2015-09-27 NOTE — ED Provider Notes (Signed)
[facility] Emergency Department Provider Note     Time seen: ----------------------------------------- 2:27 PM on 09/27/2015 -----------------------------------------    I have reviewed the triage vital signs and the nursing notes.   HISTORY  Chief Complaint Chest Pain    HPI Joanne Taylor is a 65 y.o. female who presents to ER for tightness in her chest that began this morning. Patient states his been off and on all day, it gets worse a little bit when she takes a deep breath. She denies any recent illness, denies chest pain currently. Patient states she's under a lot stress, denies reflux or physical exertion.   History reviewed. No pertinent past medical history.  Patient Active Problem List   Diagnosis Date Noted  . Seborrheic keratosis 05/29/2015  . Osteopenia 05/29/2015  . Screening for cervical cancer 10/20/2012  . Routine general medical examination at a health care facility 08/07/2012  . Overweight (BMI 25.0-29.9) 08/07/2012    Past Surgical History  Procedure Laterality Date  . Tubal ligation      Allergies Review of patient's allergies indicates no known allergies.  Social History Social History  Substance Use Topics  . Smoking status: Former Smoker    Types: Cigarettes    Quit date: 02/07/1972  . Smokeless tobacco: Never Used  . Alcohol Use: 2.5 oz/week    5 drink(s) per week    Review of Systems Constitutional: Negative for fever. Eyes: Negative for visual changes. ENT: Negative for sore throat. Cardiovascular: Positive for chest pain Respiratory: Negative for shortness of breath. Gastrointestinal: Negative for abdominal pain, vomiting and diarrhea. Genitourinary: Negative for dysuria. Musculoskeletal: Negative for back pain. Skin: Negative for rash. Neurological: Negative for headaches, focal weakness or numbness.  10-point ROS otherwise  negative.  ____________________________________________   PHYSICAL EXAM:  VITAL SIGNS: ED Triage Vitals  Enc Vitals Group     BP 09/27/15 1113 146/91 mmHg     Pulse Rate 09/27/15 1113 87     Resp 09/27/15 1113 18     Temp 09/27/15 1113 98.6 F (37 C)     Temp Source 09/27/15 1113 Oral     SpO2 09/27/15 1113 98 %     Weight 09/27/15 1113 190 lb (86.183 kg)     Height 09/27/15 1113 5\' 8"  (1.727 m)     Head Cir --      Peak Flow --      Pain Score 09/27/15 1113 1     Pain Loc --      Pain Edu? --      Excl. in [place]? --     Constitutional: Alert and oriented. Well appearing and in no distress. Eyes: Conjunctivae are normal. PERRL. Normal extraocular movements. ENT   Head: Normocephalic and atraumatic.   Nose: No congestion/rhinnorhea.   Mouth/Throat: Mucous membranes are moist.   Neck: No stridor. Cardiovascular: Normal rate, regular rhythm. Normal and symmetric distal pulses are present in all extremities. No murmurs, rubs, or gallops. Respiratory: Normal respiratory effort without tachypnea nor retractions. Breath sounds are clear and equal bilaterally. No wheezes/rales/rhonchi. Gastrointestinal: Soft and nontender. No distention. No abdominal bruits.  Musculoskeletal: Nontender with normal range of motion in all extremities. No joint effusions.  No lower extremity tenderness nor edema. Neurologic:  Normal speech and language. No gross focal neurologic deficits are appreciated. Speech is normal. No gait instability. Skin:  Skin is warm, dry and intact. No rash noted. Psychiatric: Mood and affect are normal. Speech and behavior are normal. Patient exhibits appropriate  insight and judgment. ____________________________________________  EKG: Interpreted by me. Normal sinus rhythm rate 88 bpm, normal PR interval, normal QRS, normal QT interval. Slight criteria for LVH. Normal axis.  ____________________________________________  ED COURSE:  Pertinent labs & imaging  results that were available during my care of the patient were reviewed by me and considered in my medical decision making (see chart for details). Patient is no acute distress, looks very healthy. She is low risk for ACS. ____________________________________________    LABS (pertinent positives/negatives)  Labs Reviewed  COMPREHENSIVE METABOLIC PANEL - Abnormal; Notable for the following:    Glucose, Bld 106 (*)    All other components within normal limits  CBC WITH DIFFERENTIAL/PLATELET - Abnormal; Notable for the following:    Neutro Abs 6.7 (*)    All other components within normal limits  TROPONIN I  TROPONIN I  FIBRIN DERIVATIVES D-DIMER ([facility] ONLY)    RADIOLOGY  Chest x-ray Is unremarkable ____________________________________________  FINAL ASSESSMENT AND PLAN  Chest pain  Plan: Patient with labs and imaging as dictated above. Etiology for the chest pain. Patient care has been checked out to Dr. Corky Downs for final disposition.   Earleen Newport, MD   Earleen Newport, MD 09/27/15 [phone]

## 2015-09-27 NOTE — ED Provider Notes (Signed)
Asked to follow-up on second troponin and d-dimer. If both normal patient is appropriate for discharge per Dr. Mallie Mussel, MD 09/27/15 [phone]

## 2015-09-27 NOTE — ED Notes (Signed)
Pt verbalized understanding of discharge instructions. NAD at this time. 

## 2015-09-27 NOTE — ED Notes (Signed)
MD Jimmye Norman at beside.

## 2015-09-29 MED ORDER — ONDANSETRON HCL 4 MG/2ML IJ SOLN
INTRAMUSCULAR | Status: AC
  Filled 2015-09-29: qty 2

## 2015-10-15 ENCOUNTER — Encounter: Payer: Self-pay | Admitting: Internal Medicine

## 2015-10-16 ENCOUNTER — Other Ambulatory Visit: Payer: Self-pay | Admitting: Internal Medicine

## 2015-10-16 MED ORDER — CONJ ESTROG-MEDROXYPROGEST ACE 0.3-1.5 MG PO TABS: 1.0000 | ORAL_TABLET | Freq: Every day | ORAL | Status: DC

## 2015-10-16 NOTE — Telephone Encounter (Signed)
Refilled for 30 days 

## 2015-10-16 NOTE — Telephone Encounter (Signed)
Patient is has a appointment on April 3. Medication last refilled on 10/2014. Please advise?

## 2015-11-10 ENCOUNTER — Other Ambulatory Visit (HOSPITAL_COMMUNITY)
Admission: RE | Admit: 2015-11-10 | Discharge: 2015-11-10 | Disposition: A | Discharge status: Home / Self Care | Payer: BC Managed Care – PPO | Source: Ambulatory Visit | Attending: Internal Medicine | Admitting: Internal Medicine

## 2015-11-10 ENCOUNTER — Encounter: Payer: Self-pay | Admitting: Internal Medicine

## 2015-11-10 ENCOUNTER — Ambulatory Visit (INDEPENDENT_AMBULATORY_CARE_PROVIDER_SITE_OTHER): Payer: BC Managed Care – PPO | Admitting: Internal Medicine

## 2015-11-10 VITALS — BP 116/78 | HR 69 | Temp 97.6°F | Resp 12 | Ht 68.0 in | Wt 194.2 lb

## 2015-11-10 DIAGNOSIS — E785 Hyperlipidemia, unspecified: Secondary | ICD-10-CM | POA: Diagnosis not present

## 2015-11-10 DIAGNOSIS — R5383 Other fatigue: Secondary | ICD-10-CM

## 2015-11-10 DIAGNOSIS — Z124 Encounter for screening for malignant neoplasm of cervix: Secondary | ICD-10-CM

## 2015-11-10 DIAGNOSIS — E559 Vitamin D deficiency, unspecified: Secondary | ICD-10-CM

## 2015-11-10 DIAGNOSIS — Z1151 Encounter for screening for human papillomavirus (HPV): Secondary | ICD-10-CM | POA: Insufficient documentation

## 2015-11-10 DIAGNOSIS — Z0000 Encounter for general adult medical examination without abnormal findings: Secondary | ICD-10-CM | POA: Diagnosis not present

## 2015-11-10 DIAGNOSIS — Z01419 Encounter for gynecological examination (general) (routine) without abnormal findings: Secondary | ICD-10-CM | POA: Insufficient documentation

## 2015-11-10 LAB — COMPREHENSIVE METABOLIC PANEL
ALBUMIN: 4.3 g/dL (ref 3.5–5.2)
ALK PHOS: 82 U/L (ref 39–117)
ALT: 14 U/L (ref 0–35)
AST: 17 U/L (ref 0–37)
BUN: 15 mg/dL (ref 6–23)
CO2: 28 mEq/L (ref 19–32)
CREATININE: 0.9 mg/dL (ref 0.40–1.20)
Calcium: 9.8 mg/dL (ref 8.4–10.5)
Chloride: 104 mEq/L (ref 96–112)
GFR: 66.65 mL/min (ref >60.00)
GLUCOSE: 91 mg/dL (ref 70–99)
Potassium: 4.1 mEq/L (ref 3.5–5.1)
SODIUM: 140 meq/L (ref 135–145)
TOTAL PROTEIN: 7 g/dL (ref 6.0–8.3)
Total Bilirubin: 0.7 mg/dL (ref 0.2–1.2)

## 2015-11-10 LAB — CBC WITH DIFFERENTIAL/PLATELET
BASOS ABS: 0 10*3/uL (ref 0.0–0.1)
BASOS PCT: 0.5 % (ref 0.0–3.0)
EOS ABS: 0.1 10*3/uL (ref 0.0–0.7)
Eosinophils Relative: 1.5 % (ref 0.0–5.0)
HEMATOCRIT: 42.2 % (ref 36.0–46.0)
Hemoglobin: 14.2 g/dL (ref 12.0–15.0)
LYMPHS ABS: 2 10*3/uL (ref 0.7–4.0)
LYMPHS PCT: 29.1 % (ref 12.0–46.0)
MCHC: 33.7 g/dL (ref 30.0–36.0)
MCV: 88.5 fl (ref 78.0–100.0)
MONOS PCT: 7.3 % (ref 3.0–12.0)
Monocytes Absolute: 0.5 10*3/uL (ref 0.1–1.0)
NEUTROS ABS: 4.3 10*3/uL (ref 1.4–7.7)
NEUTROS PCT: 61.6 % (ref 43.0–77.0)
PLATELETS: 255 10*3/uL (ref 150.0–400.0)
RBC: 4.77 Mil/uL (ref 3.87–5.11)
RDW: 14.4 % (ref 11.5–15.5)
WBC: 7 10*3/uL (ref 4.0–10.5)

## 2015-11-10 LAB — TSH: TSH: 4.18 u[IU]/mL (ref 0.35–4.50)

## 2015-11-10 LAB — LIPID PANEL
CHOL/HDL RATIO: 2
Cholesterol: 188 mg/dL (ref 0–200)
HDL: 80.2 mg/dL (ref >39.00)
LDL Cholesterol: 85 mg/dL (ref 0–99)
NONHDL: 107.9
Triglycerides: 113 mg/dL (ref 0.0–149.0)
VLDL: 22.6 mg/dL (ref 0.0–40.0)

## 2015-11-10 LAB — VITAMIN D 25 HYDROXY (VIT D DEFICIENCY, FRACTURES): VITD: 34.61 ng/mL (ref 30.00–100.00)

## 2015-11-10 NOTE — Patient Instructions (Signed)

## 2015-11-10 NOTE — Progress Notes (Signed)
Patient ID: Joanne Taylor, female    DOB: [birth year]  Age: 65 y.o. MRN: [id number]  The patient is here for annual  wellness examination and management of other chronic and acute problems.  Pap SMEAR DUE TODAY COLONOSCOPY IN 3 WEEKS FOR HISTORY OF POLYP S    The risk factors are reflected in the social history.  The roster of all physicians providing medical care to patient - is listed in the Snapshot section of the chart.  Activities of daily living:  The patient is 100% independent in all ADLs: dressing, toileting, feeding as well as independent mobility  Home safety : The patient has smoke detectors in the home. They wear seatbelts.  There are no firearms at home. There is no violence in the home.   There is no risks for hepatitis, STDs or HIV. There is no   history of blood transfusion. They have no travel history to infectious disease endemic areas of the world.  The patient has seen their dentist in the last six month. They have seen their eye doctor in the last year. They admit to slight hearing difficulty with regard to whispered voices and some television programs.  They have deferred audiologic testing in the last year.  They do not  have excessive sun exposure. Discussed the need for sun protection: hats, long sleeves and use of sunscreen if there is significant sun exposure.   Diet: the importance of a healthy diet is discussed. They do have a healthy diet.  The benefits of regular aerobic exercise were discussed. She walks 4 times per week ,  20 minutes.   Depression screen: there are no signs or vegative symptoms of depression- irritability, change in appetite, anhedonia, sadness/tearfullness.  Cognitive assessment: the patient manages all their financial and personal affairs and is actively engaged. They could relate day,date,year and events; recalled 2/3 objects at 3 minutes; performed clock-face test normally.  The following portions of the patient's history were  reviewed and updated as appropriate: allergies, current medications, past family history, past medical history,  past surgical history, past social history  and problem list.  Visual acuity was not assessed per patient preference since she has regular follow up with her ophthalmologist. Hearing and body mass index were assessed and reviewed.   During the course of the visit the patient was educated and counseled about appropriate screening and preventive services including : fall prevention , diabetes screening, nutrition counseling, colorectal cancer screening, and recommended immunizations.    CC: The primary encounter diagnosis was Hyperlipidemia. Diagnoses of Other fatigue, Cervical cancer screening, Vitamin D deficiency, and Visit for preventive health examination were also pertinent to this visit.   Er VISIT IN [place] FOR ATYPICAL CHEST PAIN LASTING SEVERAL HOURS, OCCURRED WHILE SLEEPING.  tropnins and EKG done  History Joanne Taylor has no past medical history on file.   She has past surgical history that includes Tubal ligation.   Her family history includes Cancer in her father; Hyperlipidemia in her father; Hypertension in her mother; Mental illness in her father and mother.She reports that she quit smoking about 43 years ago. Her smoking use included Cigarettes. She has never used smokeless tobacco. She reports that she drinks about 2.5 oz of alcohol per week. She reports that she does not use illicit drugs.  Outpatient Prescriptions Prior to Visit  Medication Sig Dispense Refill  . Calcium Carb-Cholecalciferol (CALCIUM 1000 + D) 1000-800 MG-UNIT TABS Take by mouth.    . estrogen, conjugated,-medroxyprogesterone (PREMPRO) 0.3-1.5 MG tablet  Take 1 tablet by mouth daily. 28 tablet 0  . Multiple Vitamin (MULTIVITAMIN) tablet Take 1 tablet by mouth daily.    . Probiotic Product (PROBIOTIC FORMULA PO) Take by mouth.     No facility-administered medications prior to visit.    Review of  Systems  Patient denies headache, fevers, malaise, unintentional weight loss, skin rash, eye pain, sinus congestion and sinus pain, sore throat, dysphagia,  hemoptysis , cough, dyspnea, wheezing, chest pain, palpitations, orthopnea, edema, abdominal pain, nausea, melena, diarrhea, constipation, flank pain, dysuria, hematuria, urinary  Frequency, nocturia, numbness, tingling, seizures,  Focal weakness, Loss of consciousness,  Tremor, insomnia, depression, anxiety, and suicidal ideation.     Objective:  BP 116/78 mmHg  Pulse 69  Temp(Src) 97.6 F (36.4 C) (Oral)  Resp 12  Ht 5\' 8"  (1.727 m)  Wt 194 lb 4 oz (88.111 kg)  BMI 29.54 kg/m2  SpO2 96%  Physical Exam   General Appearance:    Alert, cooperative, no distress, appears stated age  Head:    Normocephalic, without obvious abnormality, atraumatic  Eyes:    PERRL, conjunctiva/corneas clear, EOM's intact, fundi    benign, both eyes  Ears:    Normal TM's and external ear canals, both ears  Nose:   Nares normal, septum midline, mucosa normal, no drainage    or sinus tenderness  Throat:   Lips, mucosa, and tongue normal; teeth and gums normal  Neck:   Supple, symmetrical, trachea midline, no adenopathy;    thyroid:  no enlargement/tenderness/nodules; no carotid   bruit or JVD  Back:     Symmetric, no curvature, ROM normal, no CVA tenderness  Lungs:     Clear to auscultation bilaterally, respirations unlabored  Chest Wall:    No tenderness or deformity   Heart:    Regular rate and rhythm, S1 and S2 normal, no murmur, rub   or gallop  Breast Exam:    No tenderness, masses, or nipple abnormality  Abdomen:     Soft, non-tender, bowel sounds active all four quadrants,    no masses, no organomegaly  Genitalia:    Pelvic: cervix normal in appearance, external genitalia normal, no adnexal masses or tenderness, no cervical motion tenderness, rectovaginal septum normal, uterus normal size, shape, and consistency and vagina normal without  discharge  Extremities:   Extremities normal, atraumatic, no cyanosis or edema  Pulses:   2+ and symmetric all extremities  Skin:   Skin color, texture, turgor normal, no rashes or lesions  Lymph nodes:   Cervical, supraclavicular, and axillary nodes normal  Neurologic:   CNII-XII intact, normal strength, sensation and reflexes    throughout      Assessment & Plan:   Problem List Items Addressed This Visit    Visit for preventive health examination    Annual comprehensive preventive exam was done as well as an evaluation and management of chronic conditions .  During the course of the visit the patient was educated and counseled about appropriate screening and preventive services including :  diabetes screening, lipid analysis with projected  10 year  risk for CAD , nutrition counseling, breast, cervical and colorectal cancer screening, and recommended immunizations.  Printed recommendations for health maintenance screenings was given       Other Visit Diagnoses    Hyperlipidemia    -  Primary    Relevant Orders    Lipid panel (Completed)    Other fatigue        Relevant Orders  Comprehensive metabolic panel (Completed)    TSH (Completed)    CBC with Differential/Platelet (Completed)    Cervical cancer screening        Relevant Orders    Cytology - PAP (Completed)    Vitamin D deficiency        Relevant Orders    VITAMIN D 25 Hydroxy (Vit-D Deficiency, Fractures) (Completed)       I am having Joanne Taylor maintain her multivitamin, Calcium Carb-Cholecalciferol, Probiotic Product (PROBIOTIC FORMULA PO), and estrogen (conjugated)-medroxyprogesterone.  No orders of the defined types were placed in this encounter.    There are no discontinued medications.  Follow-up: No Follow-up on file.   Crecencio Mc, MD

## 2015-11-10 NOTE — Progress Notes (Signed)
Pre-visit discussion using our clinic review tool. No additional management support is needed unless otherwise documented below in the visit note.  

## 2015-11-11 LAB — CYTOLOGY - PAP

## 2015-11-11 NOTE — Assessment & Plan Note (Signed)
Annual comprehensive preventive exam was done as well as an evaluation and management of chronic conditions .  During the course of the visit the patient was educated and counseled about appropriate screening and preventive services including :  diabetes screening, lipid analysis with projected  10 year  risk for CAD , nutrition counseling, breast, cervical and colorectal cancer screening, and recommended immunizations.  Printed recommendations for health maintenance screenings was given 

## 2015-11-12 ENCOUNTER — Encounter: Payer: Self-pay | Admitting: Internal Medicine

## 2015-11-16 ENCOUNTER — Other Ambulatory Visit: Payer: Self-pay | Admitting: Internal Medicine

## 2016-01-08 ENCOUNTER — Encounter: Payer: Self-pay | Admitting: *Deleted

## 2016-01-09 ENCOUNTER — Encounter
Admission: RE | Disposition: A | Discharge status: Home / Self Care | Payer: Self-pay | Source: Ambulatory Visit | Attending: Gastroenterology

## 2016-01-09 ENCOUNTER — Ambulatory Visit: Payer: BC Managed Care – PPO | Admitting: Anesthesiology

## 2016-01-09 ENCOUNTER — Encounter: Payer: Self-pay | Admitting: Anesthesiology

## 2016-01-09 ENCOUNTER — Ambulatory Visit
Admission: RE | Admit: 2016-01-09 | Discharge: 2016-01-09 | Disposition: A | Discharge status: Home / Self Care | Payer: BC Managed Care – PPO | Source: Ambulatory Visit | Attending: Gastroenterology | Admitting: Gastroenterology

## 2016-01-09 DIAGNOSIS — Z800 Family history of malignant neoplasm of digestive organs: Secondary | ICD-10-CM | POA: Diagnosis not present

## 2016-01-09 DIAGNOSIS — M8580 Other specified disorders of bone density and structure, unspecified site: Secondary | ICD-10-CM | POA: Diagnosis not present

## 2016-01-09 DIAGNOSIS — Z1211 Encounter for screening for malignant neoplasm of colon: Secondary | ICD-10-CM | POA: Diagnosis not present

## 2016-01-09 DIAGNOSIS — K5730 Diverticulosis of large intestine without perforation or abscess without bleeding: Secondary | ICD-10-CM | POA: Insufficient documentation

## 2016-01-09 DIAGNOSIS — Z79899 Other long term (current) drug therapy: Secondary | ICD-10-CM | POA: Insufficient documentation

## 2016-01-09 HISTORY — DX: Other specified disorders of bone density and structure, unspecified site: M85.80

## 2016-01-09 HISTORY — PX: COLONOSCOPY WITH PROPOFOL: SHX5780

## 2016-01-09 SURGERY — COLONOSCOPY WITH PROPOFOL
Anesthesia: General

## 2016-01-09 MED ORDER — FENTANYL CITRATE (PF) 100 MCG/2ML IJ SOLN
INTRAMUSCULAR | Status: DC | PRN
  Administered 2016-01-09: 50 ug via INTRAVENOUS

## 2016-01-09 MED ORDER — PROPOFOL 10 MG/ML IV BOLUS
INTRAVENOUS | Status: DC | PRN
  Administered 2016-01-09: 30 mg via INTRAVENOUS
  Administered 2016-01-09: 20 mg via INTRAVENOUS
  Administered 2016-01-09: 50 mg via INTRAVENOUS

## 2016-01-09 MED ORDER — SODIUM CHLORIDE 0.9 % IV SOLN: INTRAVENOUS | Status: DC

## 2016-01-09 MED ORDER — MIDAZOLAM HCL 2 MG/2ML IJ SOLN
INTRAMUSCULAR | Status: DC | PRN
  Administered 2016-01-09: 1 mg via INTRAVENOUS

## 2016-01-09 MED ORDER — PROPOFOL 500 MG/50ML IV EMUL
INTRAVENOUS | Status: DC | PRN
  Administered 2016-01-09: 100 ug/kg/min via INTRAVENOUS

## 2016-01-09 MED ORDER — SODIUM CHLORIDE 0.9 % IV SOLN
INTRAVENOUS | Status: DC
  Administered 2016-01-09: 1000 mL via INTRAVENOUS
  Administered 2016-01-09 (×2): via INTRAVENOUS

## 2016-01-09 NOTE — Anesthesia Procedure Notes (Signed)
Date/Time: 01/09/2016 3:20 PM Performed by: Nelda Marseille Pre-anesthesia Checklist: Patient identified, Emergency Drugs available, Suction available, Patient being monitored and Timeout performed Oxygen Delivery Method: Nasal cannula

## 2016-01-09 NOTE — H&P (Signed)
Outpatient short stay form Pre-procedure 01/09/2016 2:52 PM Lollie Sails MD  Primary Physician: Dr Deborra Medina  Reason for visit:  Colonoscopy  History of present illness:  Patient is a 65 year old female presenting for colonoscopy as above. He tolerated her prep well. She takes no blood thinning products or aspirin products. There is a family history of colon cancer in paternal grandmother. She tolerated her prep well.    Current facility-administered medications:  .  0.9 %  sodium chloride infusion, , Intravenous, Continuous, Lollie Sails, MD, Last Rate: 20 mL/hr at 01/09/16 1421, 1,000 mL at 01/09/16 1421 .  0.9 %  sodium chloride infusion, , Intravenous, Continuous, Lollie Sails, MD  Prescriptions prior to admission  Medication Sig Dispense Refill Last Dose  . Calcium Carb-Cholecalciferol (CALCIUM 1000 + D) 1000-800 MG-UNIT TABS Take by mouth.   Past Week at Unknown time  . Multiple Vitamin (MULTIVITAMIN) tablet Take 1 tablet by mouth daily.   Past Week at Unknown time  . PREMPRO 0.3-1.5 MG tablet TAKE 1 TABLET BY MOUTH DAILY. 28 tablet 3 01/08/2016 at Unknown time  . Probiotic Product (PROBIOTIC FORMULA PO) Take by mouth.   Past Week at Unknown time     No Known Allergies   Past Medical History  Diagnosis Date  . Osteopenia     Review of systems:      Physical Exam    Heart and lungs: Regular rate and rhythm without rub or gallop, lungs are bilaterally clear.    HEENT: Normocephalic atraumatic eyes are anicteric    Other:     Pertinant exam for procedure: Soft nontender nondistended bowel sounds positive normoactive.    Planned proceedures: Colonoscopy and indicated procedures. I have discussed the risks benefits and complications of procedures to include not limited to bleeding, infection, perforation and the risk of sedation and the patient wishes to proceed.    Lollie Sails, MD Gastroenterology 01/09/2016  2:52 PM

## 2016-01-09 NOTE — Op Note (Signed)
[facility] Gastroenterology Patient Name: Joanne Taylor Procedure Date: 01/09/2016 2:51 PM MRN: [id number] Account #: [id number] Date of Birth: 09/02/1950 Admit Type: Outpatient Age: 65 Room: [facility] ENDO ROOM 3 Gender: Female Note Status: Finalized Procedure:            Colonoscopy Indications:          Screening for colorectal malignant neoplasm Providers:            Lollie Sails, MD Referring MD:         Deborra Medina, MD (Referring MD) Medicines:            Monitored Anesthesia Care Complications:        No immediate complications. Procedure:            Pre-Anesthesia Assessment:                       - ASA Grade Assessment: II - A patient with mild                        systemic disease.                       After obtaining informed consent, the colonoscope was                        passed under direct vision. Throughout the procedure,                        the patient's blood pressure, pulse, and oxygen                        saturations were monitored continuously. The                        Colonoscope was introduced through the anus and                        advanced to the the cecum, identified by appendiceal                        orifice and ileocecal valve. The colonoscopy was                        unusually difficult due to significant looping and a                        tortuous colon. Successful completion of the procedure                        was aided by changing the patient to a supine position,                        changing the patient to a prone position, using manual                        pressure and withdrawing and reinserting the scope. The                        patient tolerated the procedure well. The quality of  the bowel preparation was good. Findings:      The sigmoid colon, descending colon and transverse colon were       significantly redundant.      A few medium-mouthed diverticula were  found in the descending colon.      A few small-mouthed diverticula were found in the sigmoid colon.      The digital rectal exam was normal. Impression:           - Redundant colon.                       - Diverticulosis in the descending colon.                       - Diverticulosis in the sigmoid colon.                       - No specimens collected. Recommendation:       - Discharge patient to home. Procedure Code(s):    --- Professional ---                       [phone], Colonoscopy, flexible; diagnostic, including                        collection of specimen(s) by brushing or washing, when                        performed (separate procedure) Diagnosis Code(s):    --- Professional ---                       Z12.11, Encounter for screening for malignant neoplasm                        of colon                       K57.30, Diverticulosis of large intestine without                        perforation or abscess without bleeding                       Q43.8, Other specified congenital malformations of                        intestine CPT copyright 2016 [employer]. All rights reserved. The codes documented in this report are preliminary and upon coder review may  be revised to meet current compliance requirements. Lollie Sails, MD 01/09/2016 3:51:10 PM This report has been signed electronically. Number of Addenda: 0 Note Initiated On: 01/09/2016 2:51 PM Scope Withdrawal Time: 0 hours 3 minutes 30 seconds  Total Procedure Duration: 0 hours 41 minutes 1 second       [facility]

## 2016-01-09 NOTE — Anesthesia Preprocedure Evaluation (Addendum)
Anesthesia Evaluation  Patient identified by MRN, date of birth, ID band Patient awake    Reviewed: Allergy & Precautions, NPO status , Patient's Chart, lab work & pertinent test results, reviewed documented beta blocker date and time   Airway Mallampati: II  TM Distance: >3 FB     Dental  (+) Chipped   Pulmonary former smoker,           Cardiovascular      Neuro/Psych    GI/Hepatic   Endo/Other    Renal/GU      Musculoskeletal   Abdominal   Peds  Hematology   Anesthesia Other Findings CXR from feb [place].  Reproductive/Obstetrics                            Anesthesia Physical Anesthesia Plan  ASA: II  Anesthesia Plan: General   Post-op Pain Management:    Induction: Intravenous  Airway Management Planned: Nasal Cannula  Additional Equipment:   Intra-op Plan:   Post-operative Plan:   Informed Consent: I have reviewed the patients History and Physical, chart, labs and discussed the procedure including the risks, benefits and alternatives for the proposed anesthesia with the patient or authorized representative who has indicated his/her understanding and acceptance.     Plan Discussed with: CRNA  Anesthesia Plan Comments:         Anesthesia Quick Evaluation

## 2016-01-09 NOTE — Anesthesia Postprocedure Evaluation (Signed)
Anesthesia Post Note  Patient: Joanne Taylor  Procedure(s) Performed: Procedure(s) (LRB): COLONOSCOPY WITH PROPOFOL (N/A)  Patient location during evaluation: PACU Anesthesia Type: General Level of consciousness: awake and alert Pain management: pain level controlled Vital Signs Assessment: post-procedure vital signs reviewed and stable Respiratory status: spontaneous breathing, nonlabored ventilation, respiratory function stable and patient connected to nasal cannula oxygen Cardiovascular status: blood pressure returned to baseline and stable Postop Assessment: no signs of nausea or vomiting Anesthetic complications: no    Last Vitals:  Filed Vitals:   01/09/16 1620 01/09/16 1630  BP: 123/87 125/86  Pulse: 70   Temp:    Resp: 21 18    Last Pain: There were no vitals filed for this visit.               Molli Barrows

## 2016-01-09 NOTE — Transfer of Care (Signed)
Immediate Anesthesia Transfer of Care Note  Patient: Joanne Taylor  Procedure(s) Performed: Procedure(s): COLONOSCOPY WITH PROPOFOL (N/A)  Patient Location: PACU  Anesthesia Type:General  Level of Consciousness: sedated  Airway & Oxygen Therapy: Patient Spontanous Breathing and Patient connected to nasal cannula oxygen  Post-op Assessment: Report given to RN and Post -op Vital signs reviewed and stable  Post vital signs: Reviewed and stable  Last Vitals:  Filed Vitals:   01/09/16 1412  BP: 130/84  Pulse: 94  Temp: 36.7 C  Resp: 22    Last Pain: There were no vitals filed for this visit.       Complications: No apparent anesthesia complications

## 2016-01-12 ENCOUNTER — Encounter: Payer: Self-pay | Admitting: Gastroenterology

## 2016-02-06 ENCOUNTER — Other Ambulatory Visit: Payer: Self-pay | Admitting: Internal Medicine

## 2016-02-06 DIAGNOSIS — Z1231 Encounter for screening mammogram for malignant neoplasm of breast: Secondary | ICD-10-CM

## 2016-03-01 ENCOUNTER — Ambulatory Visit
Admission: RE | Admit: 2016-03-01 | Discharge: 2016-03-01 | Disposition: A | Discharge status: Home / Self Care | Payer: BC Managed Care – PPO | Source: Ambulatory Visit | Attending: Internal Medicine | Admitting: Internal Medicine

## 2016-03-01 ENCOUNTER — Other Ambulatory Visit: Payer: Self-pay | Admitting: Internal Medicine

## 2016-03-01 DIAGNOSIS — Z1231 Encounter for screening mammogram for malignant neoplasm of breast: Secondary | ICD-10-CM | POA: Insufficient documentation

## 2016-03-11 ENCOUNTER — Other Ambulatory Visit: Payer: Self-pay | Admitting: Internal Medicine

## 2016-09-03 ENCOUNTER — Other Ambulatory Visit: Payer: Self-pay | Admitting: Internal Medicine

## 2016-09-03 NOTE — Telephone Encounter (Signed)
Pt last refill for Prempro was on 08/05/16. Pt has no future scheduled appts. Last OV and labs were on 11/10/15

## 2016-10-11 ENCOUNTER — Encounter: Payer: Self-pay | Admitting: Internal Medicine

## 2016-10-14 ENCOUNTER — Other Ambulatory Visit: Payer: Self-pay | Admitting: Internal Medicine

## 2016-10-14 MED ORDER — PROGESTERONE MICRONIZED 100 MG PO CAPS: 100.0000 mg | ORAL_CAPSULE | Freq: Every day | ORAL | 5 refills | Status: DC

## 2016-10-14 MED ORDER — ESTRADIOL 0.5 MG PO TABS: 0.5000 mg | ORAL_TABLET | Freq: Every day | ORAL | 5 refills | Status: DC

## 2016-10-28 NOTE — Telephone Encounter (Signed)
Mailed unread message to patient.  

## 2016-11-16 ENCOUNTER — Ambulatory Visit (INDEPENDENT_AMBULATORY_CARE_PROVIDER_SITE_OTHER): Payer: Medicare Other | Admitting: Internal Medicine

## 2016-11-16 ENCOUNTER — Encounter: Payer: Self-pay | Admitting: Internal Medicine

## 2016-11-16 VITALS — BP 124/70 | HR 92 | Temp 98.0°F | Resp 16 | Ht 67.5 in | Wt 196.2 lb

## 2016-11-16 DIAGNOSIS — R5383 Other fatigue: Secondary | ICD-10-CM | POA: Diagnosis not present

## 2016-11-16 DIAGNOSIS — J3089 Other allergic rhinitis: Secondary | ICD-10-CM

## 2016-11-16 DIAGNOSIS — Z23 Encounter for immunization: Secondary | ICD-10-CM

## 2016-11-16 DIAGNOSIS — Z0000 Encounter for general adult medical examination without abnormal findings: Secondary | ICD-10-CM | POA: Diagnosis not present

## 2016-11-16 DIAGNOSIS — Z124 Encounter for screening for malignant neoplasm of cervix: Secondary | ICD-10-CM | POA: Diagnosis not present

## 2016-11-16 DIAGNOSIS — E559 Vitamin D deficiency, unspecified: Secondary | ICD-10-CM | POA: Diagnosis not present

## 2016-11-16 DIAGNOSIS — M8580 Other specified disorders of bone density and structure, unspecified site: Secondary | ICD-10-CM

## 2016-11-16 DIAGNOSIS — E663 Overweight: Secondary | ICD-10-CM | POA: Diagnosis not present

## 2016-11-16 LAB — COMPREHENSIVE METABOLIC PANEL
ALBUMIN: 4.2 g/dL (ref 3.5–5.2)
ALT: 22 U/L (ref 0–35)
AST: 23 U/L (ref 0–37)
Alkaline Phosphatase: 87 U/L (ref 39–117)
BUN: 12 mg/dL (ref 6–23)
CHLORIDE: 104 meq/L (ref 96–112)
CO2: 29 mEq/L (ref 19–32)
Calcium: 9.7 mg/dL (ref 8.4–10.5)
Creatinine, Ser: 0.94 mg/dL (ref 0.40–1.20)
GFR: 63.19 mL/min (ref >60.00)
Glucose, Bld: 90 mg/dL (ref 70–99)
POTASSIUM: 4.4 meq/L (ref 3.5–5.1)
SODIUM: 138 meq/L (ref 135–145)
Total Bilirubin: 0.5 mg/dL (ref 0.2–1.2)
Total Protein: 6.7 g/dL (ref 6.0–8.3)

## 2016-11-16 LAB — CBC WITH DIFFERENTIAL/PLATELET
Basophils Absolute: 0.1 10*3/uL (ref 0.0–0.1)
Basophils Relative: 0.6 % (ref 0.0–3.0)
EOS PCT: 2.2 % (ref 0.0–5.0)
Eosinophils Absolute: 0.2 10*3/uL (ref 0.0–0.7)
HEMATOCRIT: 42.2 % (ref 36.0–46.0)
HEMOGLOBIN: 14 g/dL (ref 12.0–15.0)
LYMPHS ABS: 2.1 10*3/uL (ref 0.7–4.0)
Lymphocytes Relative: 23.8 % (ref 12.0–46.0)
MCHC: 33.1 g/dL (ref 30.0–36.0)
MCV: 89 fl (ref 78.0–100.0)
MONOS PCT: 6.9 % (ref 3.0–12.0)
Monocytes Absolute: 0.6 10*3/uL (ref 0.1–1.0)
Neutro Abs: 5.8 10*3/uL (ref 1.4–7.7)
Neutrophils Relative %: 66.5 % (ref 43.0–77.0)
Platelets: 273 10*3/uL (ref 150.0–400.0)
RBC: 4.74 Mil/uL (ref 3.87–5.11)
RDW: 14 % (ref 11.5–15.5)
WBC: 8.7 10*3/uL (ref 4.0–10.5)

## 2016-11-16 LAB — VITAMIN D 25 HYDROXY (VIT D DEFICIENCY, FRACTURES): VITD: 44.97 ng/mL (ref 30.00–100.00)

## 2016-11-16 LAB — TSH: TSH: 3.59 u[IU]/mL (ref 0.35–4.50)

## 2016-11-16 NOTE — Progress Notes (Signed)
Patient ID: Joanne Taylor, female    DOB: 03-22-1951  Age: 66 y.o. MRN: [id number]  The patient is here for annual  Physical examination :  Screening history reviewed:  Mammogram July 2017 Colonoscopy June 2017 Hep c screen 2015 Pap smear 2017 at age 69 (last one) DEXA  2016   Has lost 12 lbs intentionally since Christmas, her  goal is 165 lbs .  Exercising daily including water aerobics and exercise classes    The risk factors are reflected in the social history.  The roster of all physicians providing medical care to patient - is listed in the Snapshot section of the chart.  Activities of daily living:  The patient is 100% independent in all ADLs: dressing, toileting, feeding as well as independent mobility  Home safety : The patient has smoke detectors in the home. They wear seatbelts.  There are no firearms at home. There is no violence in the home.   There is no risks for hepatitis, STDs or HIV. There is no   history of blood transfusion. They have no travel history to infectious disease endemic areas of the world.  The patient has seen their dentist in the last six month. They have seen their eye doctor in the last year. They admit to slight hearing difficulty with regard to whispered voices and some television programs.  They have deferred audiologic testing in the last year.  They do not  have excessive sun exposure. Discussed the need for sun protection: hats, long sleeves and use of sunscreen if there is significant sun exposure.   Diet: the importance of a healthy diet is discussed. They do have a healthy diet.  The benefits of regular aerobic exercise were discussed. She walks 4 times per week ,  20 minutes.   Depression screen: there are no signs or vegative symptoms of depression- irritability, change in appetite, anhedonia, sadness/tearfullness.  Cognitive assessment: the patient manages all their financial and personal affairs and is actively engaged. They could  relate day,date,year and events; recalled 2/3 objects at 3 minutes; performed clock-face test normally.  The following portions of the patient's history were reviewed and updated as appropriate: allergies, current medications, past family history, past medical history,  past surgical history, past social history  and problem list.  Visual acuity was not assessed per patient preference since she has regular follow up with her ophthalmologist. Hearing and body mass index were assessed and reviewed.   During the course of the visit the patient was educated and counseled about appropriate screening and preventive services including : fall prevention , diabetes screening, nutrition counseling, colorectal cancer screening, and recommended immunizations.    CC: The primary encounter diagnosis was Need for 23-polyvalent pneumococcal polysaccharide vaccine. Diagnoses of Other fatigue, Vitamin D deficiency, Overweight (BMI 25.0-29.9), Visit for preventive health examination, Osteopenia, unspecified location, Screening for cervical cancer, and Allergic rhinitis due to American house dust mite were also pertinent to this visit.  Treated 3 weeks ago with steroids, both oral and inhaled topical  by ent for congestion without infection confirmed by CT sinuses.   Then had allergy testing grass and dustmites.  Follow up thursday,  Sinus  congestion improved but developed serous  Otitis and conjunctivitis of right ear with pai, was treated by  Kem Kays with augmentin .    History Joanne Taylor has a past medical history of Osteopenia.   She has a past surgical history that includes Tubal ligation; Colonoscopy; and Colonoscopy with propofol (N/A, 01/09/2016).  Her family history includes Cancer in her father; Hyperlipidemia in her father; Hypertension in her mother; Mental illness in her father and mother.She reports that she quit smoking about 44 years ago. Her smoking use included Cigarettes. She has never used  smokeless tobacco. She reports that she drinks about 2.5 oz of alcohol per week . She reports that she does not use drugs.  Outpatient Medications Prior to Visit  Medication Sig Dispense Refill  . Calcium Carb-Cholecalciferol (CALCIUM 1000 + D) 1000-800 MG-UNIT TABS Take by mouth.    . estradiol (ESTRACE) 0.5 MG tablet Take 1 tablet (0.5 mg total) by mouth daily. 30 tablet 5  . Multiple Vitamin (MULTIVITAMIN) tablet Take 1 tablet by mouth daily.    . Probiotic Product (PROBIOTIC FORMULA PO) Take by mouth.    . progesterone (PROMETRIUM) 100 MG capsule Take 1 capsule (100 mg total) by mouth daily. 30 capsule 5  . PREMPRO 0.3-1.5 MG tablet TAKE ONE TABLET BY MOUTH DAILY (Patient not taking: Reported on 11/16/2016) 28 tablet 4   No facility-administered medications prior to visit.     Review of Systems   Patient denies headache, fevers, malaise, unintentional weight loss, skin rash, eye pain, sinus congestion and sinus pain, sore throat, dysphagia,  hemoptysis , cough, dyspnea, wheezing, chest pain, palpitations, orthopnea, edema, abdominal pain, nausea, melena, diarrhea, constipation, flank pain, dysuria, hematuria, urinary  Frequency, nocturia, numbness, tingling, seizures,  Focal weakness, Loss of consciousness,  Tremor, insomnia, depression, anxiety, and suicidal ideation.      Objective:  BP 124/70   Pulse 92   Temp 98 F (36.7 C) (Oral)   Resp 16   Ht 5' 7.5" (1.715 m)   Wt 196 lb 3.2 oz (89 kg)   SpO2 95%   BMI 30.28 kg/m   Physical Exam   General appearance: alert, cooperative and appears stated age Head: Normocephalic, without obvious abnormality, atraumatic Eyes: conjunctivae/corneas clear. PERRL, EOM's intact. Fundi benign. Ears: normal TM's and external ear canals both ears Nose: Nares normal. Septum midline. Mucosa normal. No drainage or sinus tenderness. Throat: lips, mucosa, and tongue normal; teeth and gums normal Neck: no adenopathy, no carotid bruit, no JVD,  supple, symmetrical, trachea midline and thyroid not enlarged, symmetric, no tenderness/mass/nodules Lungs: clear to auscultation bilaterally Breasts: normal appearance, no masses or tenderness Heart: regular rate and rhythm, S1, S2 normal, no murmur, click, rub or gallop Abdomen: soft, non-tender; bowel sounds normal; no masses,  no organomegaly Extremities: extremities normal, atraumatic, no cyanosis or edema Pulses: 2+ and symmetric Skin: Skin color, texture, turgor normal. No rashes or lesions Neurologic: Alert and oriented X 3, normal strength and tone. Normal symmetric reflexes. Normal coordination and gait.      Assessment & Plan:   Problem List Items Addressed This Visit    Allergic rhinitis due to American house dust mite    Recommended routine sinus flushing and use of 2nd generation antihhistamines      Osteopenia    DEXA reviewed . Discussed the current controversies surrounding the risks and benefits of calcium supplementation.  Encouraged her to increase dietary calcium through natural foods including almond/coconut milk      Overweight (BMI 25.0-29.9)    I have congratulated her in reduction of   BMI and encouraged  Continued weight loss with goal of 10% of body weigh over the next 6 months using a low glycemic index diet and regular exercise a minimum of 5 days per week.  Diet discussed in detail  Screening for cervical cancer    Normal ata age 44.  No future PAP smears planned due to age.       Visit for preventive health examination    Annual comprehensive preventive exam was done as well as an evaluation and management of chronic conditions .  During the course of the visit the patient was educated and counseled about appropriate screening and preventive services including :  diabetes screening,, nutrition counseling, breast, cervical and colorectal cancer screening, and recommended immunizations.  Printed recommendations for health maintenance screenings was  given.         Other Visit Diagnoses    Need for 23-polyvalent pneumococcal polysaccharide vaccine    -  Primary   Relevant Orders   Pneumococcal polysaccharide vaccine 23-valent greater than or equal to 2yo subcutaneous/IM (Completed)   Other fatigue       Relevant Orders   Comprehensive metabolic panel (Completed)   TSH (Completed)   CBC with Differential/Platelet (Completed)   Vitamin D deficiency       Relevant Orders   VITAMIN D 25 Hydroxy (Vit-D Deficiency, Fractures) (Completed)      I have discontinued Ms. Cuoco's PREMPRO. I am also having her maintain her multivitamin, Calcium Carb-Cholecalciferol, Probiotic Product (PROBIOTIC FORMULA PO), estradiol, progesterone, amoxicillin-clavulanate, fluticasone, and ofloxacin.  Meds ordered this encounter  Medications  . amoxicillin-clavulanate (AUGMENTIN) 875-125 MG tablet    Sig: Take by mouth.  . fluticasone (FLONASE) 50 MCG/ACT nasal spray    Sig: Place into the nose.  . ofloxacin (OCUFLOX) 0.3 % ophthalmic solution    Sig: Apply to eye.    Medications Discontinued During This Encounter  Medication Reason  . PREMPRO 0.3-1.5 MG tablet Patient has not taken in last 30 days    Follow-up: No Follow-up on file.   Crecencio Mc, MD

## 2016-11-16 NOTE — Progress Notes (Signed)
Pre visit review using our clinic review tool, if applicable. No additional management support is needed unless otherwise documented below in the visit note. 

## 2016-11-16 NOTE — Patient Instructions (Addendum)
You should try [employer]'s Sinus rinse ;  It is a strong sinus "flush" using water and medicated salts.  Do it over the sink because it can be a bit messy  I  recommend getting the majority of your calcium and Vitamin D  through diet rather than supplements given the recent association of calcium supplements with increased coronary artery calcium scores.  You need 1200 mg calcium dailiy and 1000 Ius D3 daily (unless your level is low today )   Try the almond and cashew milks that most grocery stores  now carry  in the dairy  Section>   They are lactose free:  [employer] brand Almond Light,  Original formula.  Delicious,  Low carb,  Low cal,  Cholesterol free   Health Maintenance for Postmenopausal Women Menopause is a normal process in which your reproductive ability comes to an end. This process happens gradually over a span of months to years, usually between the ages of 88 and 52. Menopause is complete when you have missed 12 consecutive menstrual periods. It is important to talk with your health care provider about some of the most common conditions that affect postmenopausal women, such as heart disease, cancer, and bone loss (osteoporosis). Adopting a healthy lifestyle and getting preventive care can help to promote your health and wellness. Those actions can also lower your chances of developing some of these common conditions. What should I know about menopause? During menopause, you may experience a number of symptoms, such as:  Moderate-to-severe hot flashes.  Night sweats.  Decrease in sex drive.  Mood swings.  Headaches.  Tiredness.  Irritability.  Memory problems.  Insomnia. Choosing to treat or not to treat menopausal changes is an individual decision that you make with your health care provider. What should I know about hormone replacement therapy and supplements? Hormone therapy products are effective for treating symptoms that are associated with menopause, such as hot  flashes and night sweats. Hormone replacement carries certain risks, especially as you become older. If you are thinking about using estrogen or estrogen with progestin treatments, discuss the benefits and risks with your health care provider. What should I know about heart disease and stroke? Heart disease, heart attack, and stroke become more likely as you age. This may be due, in part, to the hormonal changes that your body experiences during menopause. These can affect how your body processes dietary fats, triglycerides, and cholesterol. Heart attack and stroke are both medical emergencies. There are many things that you can do to help prevent heart disease and stroke:  Have your blood pressure checked at least every 1-2 years. High blood pressure causes heart disease and increases the risk of stroke.  If you are 4-84 years old, ask your health care provider if you should take aspirin to prevent a heart attack or a stroke.  Do not use any tobacco products, including cigarettes, chewing tobacco, or electronic cigarettes. If you need help quitting, ask your health care provider.  It is important to eat a healthy diet and maintain a healthy weight.  Be sure to include plenty of vegetables, fruits, low-fat dairy products, and lean protein.  Avoid eating foods that are high in solid fats, added sugars, or salt (sodium).  Get regular exercise. This is one of the most important things that you can do for your health.  Try to exercise for at least 150 minutes each week. The type of exercise that you do should increase your heart rate and make you  sweat. This is known as moderate-intensity exercise.  Try to do strengthening exercises at least twice each week. Do these in addition to the moderate-intensity exercise.  Know your numbers.Ask your health care provider to check your cholesterol and your blood glucose. Continue to have your blood tested as directed by your health care provider. What  should I know about cancer screening? There are several types of cancer. Take the following steps to reduce your risk and to catch any cancer development as early as possible. Breast Cancer  Practice breast self-awareness.  This means understanding how your breasts normally appear and feel.  It also means doing regular breast self-exams. Let your health care provider know about any changes, no matter how small.  If you are 54 or older, have a clinician do a breast exam (clinical breast exam or CBE) every year. Depending on your age, family history, and medical history, it may be recommended that you also have a yearly breast X-ray (mammogram).  If you have a family history of breast cancer, talk with your health care provider about genetic screening.  If you are at high risk for breast cancer, talk with your health care provider about having an MRI and a mammogram every year.  Breast cancer (BRCA) gene test is recommended for women who have family members with BRCA-related cancers. Results of the assessment will determine the need for genetic counseling and BRCA1 and for BRCA2 testing. BRCA-related cancers include these types:  Breast. This occurs in males or females.  Ovarian.  Tubal. This may also be called fallopian tube cancer.  Cancer of the abdominal or pelvic lining (peritoneal cancer).  Prostate.  Pancreatic. Cervical, Uterine, and Ovarian Cancer  Your health care provider may recommend that you be screened regularly for cancer of the pelvic organs. These include your ovaries, uterus, and vagina. This screening involves a pelvic exam, which includes checking for microscopic changes to the surface of your cervix (Pap test).  For women ages 21-65, health care providers may recommend a pelvic exam and a Pap test every three years. For women ages 54-65, they may recommend the Pap test and pelvic exam, combined with testing for human papilloma virus (HPV), every five years. Some  types of HPV increase your risk of cervical cancer. Testing for HPV may also be done on women of any age who have unclear Pap test results.  Other health care providers may not recommend any screening for nonpregnant women who are considered low risk for pelvic cancer and have no symptoms. Ask your health care provider if a screening pelvic exam is right for you.  If you have had past treatment for cervical cancer or a condition that could lead to cancer, you need Pap tests and screening for cancer for at least 20 years after your treatment. If Pap tests have been discontinued for you, your risk factors (such as having a new sexual partner) need to be reassessed to determine if you should start having screenings again. Some women have medical problems that increase the chance of getting cervical cancer. In these cases, your health care provider may recommend that you have screening and Pap tests more often.  If you have a family history of uterine cancer or ovarian cancer, talk with your health care provider about genetic screening.  If you have vaginal bleeding after reaching menopause, tell your health care provider.  There are currently no reliable tests available to screen for ovarian cancer. Lung Cancer  Lung cancer screening is recommended  for adults 77-4 years old who are at high risk for lung cancer because of a history of smoking. A yearly low-dose CT scan of the lungs is recommended if you:  Currently smoke.  Have a history of at least 30 pack-years of smoking and you currently smoke or have quit within the past 15 years. A pack-year is smoking an average of one pack of cigarettes per day for one year. Yearly screening should:  Continue until it has been 15 years since you quit.  Stop if you develop a health problem that would prevent you from having lung cancer treatment. Colorectal Cancer  This type of cancer can be detected and can often be prevented.  Routine colorectal  cancer screening usually begins at age 51 and continues through age 35.  If you have risk factors for colon cancer, your health care provider may recommend that you be screened at an earlier age.  If you have a family history of colorectal cancer, talk with your health care provider about genetic screening.  Your health care provider may also recommend using home test kits to check for hidden blood in your stool.  A small camera at the end of a tube can be used to examine your colon directly (sigmoidoscopy or colonoscopy). This is done to check for the earliest forms of colorectal cancer.  Direct examination of the colon should be repeated every 5-10 years until age 28. However, if early forms of precancerous polyps or small growths are found or if you have a family history or genetic risk for colorectal cancer, you may need to be screened more often. Skin Cancer  Check your skin from head to toe regularly.  Monitor any moles. Be sure to tell your health care provider:  About any new moles or changes in moles, especially if there is a change in a mole's shape or color.  If you have a mole that is larger than the size of a pencil eraser.  If any of your family members has a history of skin cancer, especially at a young age, talk with your health care provider about genetic screening.  Always use sunscreen. Apply sunscreen liberally and repeatedly throughout the day.  Whenever you are outside, protect yourself by wearing long sleeves, pants, a wide-brimmed hat, and sunglasses. What should I know about osteoporosis? Osteoporosis is a condition in which bone destruction happens more quickly than new bone creation. After menopause, you may be at an increased risk for osteoporosis. To help prevent osteoporosis or the bone fractures that can happen because of osteoporosis, the following is recommended:  If you are 35-68 years old, get at least 1,000 mg of calcium and at least 600 mg of vitamin  D per day.  If you are older than age 53 but younger than age 35, get at least 1,200 mg of calcium and at least 600 mg of vitamin D per day.  If you are older than age 72, get at least 1,200 mg of calcium and at least 800 mg of vitamin D per day. Smoking and excessive alcohol intake increase the risk of osteoporosis. Eat foods that are rich in calcium and vitamin D, and do weight-bearing exercises several times each week as directed by your health care provider. What should I know about how menopause affects my mental health? Depression may occur at any age, but it is more common as you become older. Common symptoms of depression include:  Low or sad mood.  Changes in sleep patterns.  Changes in appetite or eating patterns.  Feeling an overall lack of motivation or enjoyment of activities that you previously enjoyed.  Frequent crying spells. Talk with your health care provider if you think that you are experiencing depression. What should I know about immunizations? It is important that you get and maintain your immunizations. These include:  Tetanus, diphtheria, and pertussis (Tdap) booster vaccine.  Influenza every year before the flu season begins.  Pneumonia vaccine.  Shingles vaccine. Your health care provider may also recommend other immunizations. This information is not intended to replace advice given to you by your health care provider. Make sure you discuss any questions you have with your health care provider. Document Released: 09/17/2005 Document Revised: 02/13/2016 Document Reviewed: 04/29/2015 Elsevier Interactive Patient Education  2017 [employer].

## 2016-11-18 ENCOUNTER — Encounter: Payer: Self-pay | Admitting: Internal Medicine

## 2016-11-18 DIAGNOSIS — J3089 Other allergic rhinitis: Secondary | ICD-10-CM | POA: Insufficient documentation

## 2016-11-18 NOTE — Assessment & Plan Note (Signed)
I have congratulated her in reduction of   BMI and encouraged  Continued weight loss with goal of 10% of body weigh over the next 6 months using a low glycemic index diet and regular exercise a minimum of 5 days per week.  Diet discussed in detail

## 2016-11-18 NOTE — Assessment & Plan Note (Signed)
Normal ata age 66.  No future PAP smears planned due to age.

## 2016-11-18 NOTE — Assessment & Plan Note (Signed)
Recommended routine sinus flushing and use of 2nd generation antihhistamines

## 2016-11-18 NOTE — Assessment & Plan Note (Addendum)
DEXA reviewed . Discussed the current controversies surrounding the risks and benefits of calcium supplementation.  Encouraged her to increase dietary calcium through natural foods including almond/coconut milk 

## 2016-11-18 NOTE — Assessment & Plan Note (Signed)
Annual comprehensive preventive exam was done as well as an evaluation and management of chronic conditions .  During the course of the visit the patient was educated and counseled about appropriate screening and preventive services including :  diabetes screening,, nutrition counseling, breast, cervical and colorectal cancer screening, and recommended immunizations.  Printed recommendations for health maintenance screenings was given.

## 2017-02-02 ENCOUNTER — Other Ambulatory Visit: Payer: Self-pay | Admitting: Internal Medicine

## 2017-02-02 DIAGNOSIS — Z1231 Encounter for screening mammogram for malignant neoplasm of breast: Secondary | ICD-10-CM

## 2017-03-02 ENCOUNTER — Ambulatory Visit
Admission: RE | Admit: 2017-03-02 | Discharge: 2017-03-02 | Disposition: A | Discharge status: Home / Self Care | Payer: Medicare Other | Source: Ambulatory Visit | Attending: Internal Medicine | Admitting: Internal Medicine

## 2017-03-02 DIAGNOSIS — Z1231 Encounter for screening mammogram for malignant neoplasm of breast: Secondary | ICD-10-CM

## 2017-03-03 ENCOUNTER — Encounter: Payer: Self-pay | Admitting: Internal Medicine

## 2017-03-18 NOTE — Telephone Encounter (Signed)
Mailed unread message to patient, thanks 

## 2017-05-08 ENCOUNTER — Other Ambulatory Visit: Payer: Self-pay | Admitting: Internal Medicine

## 2017-06-07 ENCOUNTER — Encounter: Payer: Self-pay | Admitting: Internal Medicine

## 2017-09-14 ENCOUNTER — Ambulatory Visit: Payer: Medicare Other

## 2017-09-16 ENCOUNTER — Ambulatory Visit (INDEPENDENT_AMBULATORY_CARE_PROVIDER_SITE_OTHER): Payer: Medicare Other

## 2017-09-16 VITALS — BP 110/70 | HR 82 | Temp 98.1°F | Resp 14 | Ht 67.5 in | Wt 202.8 lb

## 2017-09-16 DIAGNOSIS — Z0000 Encounter for general adult medical examination without abnormal findings: Secondary | ICD-10-CM | POA: Diagnosis not present

## 2017-09-16 NOTE — Progress Notes (Signed)
Subjective:   Joanne Taylor is a 67 y.o. female who presents for an Initial Medicare Annual Wellness Visit.  Review of Systems    No ROS.  Medicare Wellness Visit. Additional risk factors are reflected in the social history. Cardiac Risk Factors include: advanced age (>24men, >2 women)     Objective:    Today's Vitals   09/16/17 1629  BP: 110/70  Pulse: 82  Resp: 14  Temp: 98.1 F (36.7 C)  TempSrc: Oral  SpO2: 96%  Weight: 202 lb 12.8 oz (92 kg)  Height: 5' 7.5" (1.715 m)   Body mass index is 31.29 kg/m.  Advanced Directives 09/16/2017  Does Patient Have a Medical Advance Directive? No  Would patient like information on creating a medical advance directive? No - Patient declined    Current Medications (verified) Outpatient Encounter Medications as of 09/16/2017  Medication Sig  . Calcium Carb-Cholecalciferol (CALCIUM 1000 + D) 1000-800 MG-UNIT TABS Take by mouth.  . estradiol (ESTRACE) 0.5 MG tablet TAKE ONE TABLET BY MOUTH DAILY  . fluticasone (FLONASE) 50 MCG/ACT nasal spray Place into the nose.  . Multiple Vitamin (MULTIVITAMIN) tablet Take 1 tablet by mouth daily.  . Probiotic Product (PROBIOTIC FORMULA PO) Take by mouth.  . progesterone (PROMETRIUM) 100 MG capsule TAKE ONE CAPSULE BY MOUTH DAILY   No facility-administered encounter medications on file as of 09/16/2017.     Allergies (verified) Patient has no known allergies.   History: Past Medical History:  Diagnosis Date  . Osteopenia    Past Surgical History:  Procedure Laterality Date  . COLONOSCOPY    . COLONOSCOPY WITH PROPOFOL N/A 01/09/2016   Procedure: COLONOSCOPY WITH PROPOFOL;  Surgeon: Lollie Sails, MD;  Location: [facility] ENDOSCOPY;  Service: Endoscopy;  Laterality: N/A;  . TUBAL LIGATION     Family History  Problem Relation Age of Onset  . Hypertension Mother   . Mental illness Mother        depressive disorder  . Cancer Father        prostate  . Mental illness Father    lewy body Dementia  . Hyperlipidemia Father    Social History   Socioeconomic History  . Marital status: Married    Spouse name: None  . Number of children: None  . Years of education: None  . Highest education level: None  Social Needs  . Financial resource strain: None  . Food insecurity - worry: None  . Food insecurity - inability: None  . Transportation needs - medical: None  . Transportation needs - non-medical: None  Occupational History  . Occupation: Asst Garment/textile technologist: [employer]    Comment: at [employer]  . Smoking status: Former Smoker    Types: Cigarettes    Last attempt to quit: 02/07/1972    Years since quitting: 45.6  . Smokeless tobacco: Never Used  Substance and Sexual Activity  . Alcohol use: Yes    Alcohol/week: 2.5 oz    Types: 5 Standard drinks or equivalent per week  . Drug use: No  . Sexual activity: None  Other Topics Concern  . None  Social History Narrative  . None    Tobacco Counseling Counseling given: Not Answered   Clinical Intake:  Pre-visit preparation completed: Yes  Pain : No/denies pain     Nutritional Status: BMI > 30  Obese Diabetes: No  How often do you need to have someone help you when you read instructions, pamphlets, or other  written materials from your doctor or pharmacy?: 1 - Never  Interpreter Needed?: No      Activities of Daily Living In your present state of health, do you have any difficulty performing the following activities: 09/16/2017  Hearing? N  Vision? N  Difficulty concentrating or making decisions? N  Walking or climbing stairs? N  Dressing or bathing? N  Doing errands, shopping? N  Preparing Food and eating ? N  Using the Toilet? N  In the past six months, have you accidently leaked urine? Y  Comment Managed with a daily pad   Do you have problems with loss of bowel control? N  Managing your Medications? N  Managing your Finances? N  Housekeeping or managing your Housekeeping? N    Some recent data might be hidden     Immunizations and Health Maintenance Immunization History  Administered Date(s) Administered  . Influenza Split 05/23/2012  . Influenza-Unspecified 06/24/2013, 05/09/2014, 05/20/2015, 06/06/2017  . Pneumococcal Conjugate-13 05/28/2015  . Pneumococcal Polysaccharide-23 11/16/2016  . Tdap 02/07/2014  . Zoster 11/01/2013   There are no preventive care reminders to display for this patient.  Patient Care Team: Crecencio Mc, MD as PCP - General (Internal Medicine)  Indicate any recent Medical Services you may have received from other than [facility] providers in the past year (date may be approximate).     Assessment:   This is a routine wellness examination for Joanne Taylor.  The goal of the wellness visit is to assist the patient how to close the gaps in care and create a preventative care plan for the patient.   The roster of all physicians providing medical care to patient is listed in the Snapshot section of the chart.  Taking calcium VIT D as appropriate/Osteoporosis risk reviewed.    Safety issues reviewed; Smoke and carbon monoxide detectors in the home. No firearms or firearms locked in a safe within the home. Wears seatbelts when driving or riding with others. No violence in the home.  They do not have excessive sun exposure.  Discussed the need for sun protection: hats, long sleeves and the use of sunscreen if there is significant sun exposure.  Depression- PHQ 2 &9 complete.  There are no signs/symptoms or verbal communication regarding depression, irritability, anhedonia, sadness/tearfullness. See depression screening.  Not currently undergoing counseling.  No previous treatment for depression.  Time spent on this topic is 8 minutes.   Patient is alert, normal appearance, oriented to person/place/and time.  Correctly identified the president of the [place] and recalls of 2/3 words. Performs simple calculations and can read correct time  from watch face.  Displays appropriate judgement.  No new identified risk were noted.  No failures at ADL's or IADL's.    BMI- discussed the importance of a healthy diet, water intake and the benefits of aerobic exercise. Educational material provided.   24 hour diet recall: Regular diet  Daily fluid intake: 1-2 cups of caffeine, 4-6 cups of water.  Dental- every 6 months.  Eye- Visual acuity not assessed per patient preference since they have regular follow up with the ophthalmologist.  Wears corrective lenses.  Sleep patterns- Sleeps 6-8 hours at night.  Wakes feeling rested.  Health maintenance gaps- closed.  Patient Concerns: None at this time. Follow up with PCP as needed.  Hearing/Vision screen Hearing Screening Comments: Patient is able to hear conversational tones without difficulty.  No issues reported.   Vision Screening Comments: Followed by Dr. Gloriann Loan Wears corrective lenses Annual visits Visual  acuity not assessed per patient preference since they have regular follow up with the ophthalmologist  Dietary issues and exercise activities discussed: Current Exercise Habits: Structured exercise class, Type of exercise: yoga;strength training/weights;stretching, Time (Minutes): 45, Frequency (Times/Week): 5, Weekly Exercise (Minutes/Week): 225, Intensity: Moderate  Goals    . Lose weight     Increase physical activity Low carb diet Stay hydrated      Depression Screen PHQ 2/9 Scores 09/16/2017 11/16/2016 11/10/2015 08/07/2012  PHQ - 2 Score 0 0 0 0    Fall Risk Fall Risk  09/16/2017 11/16/2016 11/10/2015 08/07/2012  Falls in the past year? No Yes No No  Number falls in past yr: - 1 - -  Injury with Fall? - No - -   Cognitive Function: MMSE - Mini Mental State Exam 09/16/2017  Orientation to time 5  Orientation to Place 5  Registration 3  Attention/ Calculation 5  Recall 3  Language- name 2 objects 2  Language- repeat 1  Language- follow 3 step command 3   Language- read & follow direction 1  Write a sentence 1  Copy design 1  Total score 30        Screening Tests Health Maintenance  Topic Date Due  . MAMMOGRAM  03/03/2019  . TETANUS/TDAP  02/08/2024  . COLONOSCOPY  01/08/2026  . INFLUENZA VACCINE  Completed  . DEXA SCAN  Completed  . Hepatitis C Screening  Completed  . PNA vac Low Risk Adult  Completed        Plan:   End of life planning; Advanced aging; Advanced directives discussed.  No HCPOA/Living Will.  Additional information declined at this time.  I have personally reviewed and noted the following in the patient's chart:   . Medical and social history . Use of alcohol, tobacco or illicit drugs  . Current medications and supplements . Functional ability and status . Nutritional status . Physical activity . Advanced directives . List of other physicians . Hospitalizations, surgeries, and ER visits in previous 12 months . Vitals . Screenings to include cognitive, depression, and falls . Referrals and appointments  In addition, I have reviewed and discussed with patient certain preventive protocols, quality metrics, and best practice recommendations. A written personalized care plan for preventive services as well as general preventive health recommendations were provided to patient.     OBrien-Blaney, Timmi Devora L, LPN   7/0/0174    I have reviewed the above information and agree with above.   Deborra Medina, MD

## 2017-09-16 NOTE — Patient Instructions (Addendum)
  Ms. Joanne Taylor , Thank you for taking time to come for your Medicare Wellness Visit. I appreciate your ongoing commitment to your health goals. Please review the following plan we discussed and let me know if I can assist you in the future.   Follow up with Dr. Derrel Nip as needed.    Have a great day!  These are the goals we discussed: Goals    . Lose weight     Increase physical activity Low carb diet Stay hydrated       This is a list of the screening recommended for you and due dates:  Health Maintenance  Topic Date Due  . Mammogram  03/03/2019  . Tetanus Vaccine  02/08/2024  . Colon Cancer Screening  01/08/2026  . Flu Shot  Completed  . DEXA scan (bone density measurement)  Completed  .  Hepatitis C: One time screening is recommended by Center for Disease Control  (CDC) for  adults born from [birth year] through 1966.   Completed  . Pneumonia vaccines  Completed

## 2017-10-12 ENCOUNTER — Other Ambulatory Visit: Payer: Self-pay | Admitting: Internal Medicine

## 2017-11-17 ENCOUNTER — Encounter: Payer: Self-pay | Admitting: Internal Medicine

## 2017-11-17 ENCOUNTER — Other Ambulatory Visit: Payer: Self-pay

## 2017-11-17 ENCOUNTER — Ambulatory Visit (INDEPENDENT_AMBULATORY_CARE_PROVIDER_SITE_OTHER): Payer: Medicare Other | Admitting: Internal Medicine

## 2017-11-17 VITALS — BP 122/82 | HR 76 | Temp 97.5°F | Wt 203.4 lb

## 2017-11-17 DIAGNOSIS — E785 Hyperlipidemia, unspecified: Secondary | ICD-10-CM | POA: Diagnosis not present

## 2017-11-17 DIAGNOSIS — Z1239 Encounter for other screening for malignant neoplasm of breast: Secondary | ICD-10-CM

## 2017-11-17 DIAGNOSIS — M8580 Other specified disorders of bone density and structure, unspecified site: Secondary | ICD-10-CM | POA: Diagnosis not present

## 2017-11-17 DIAGNOSIS — R7989 Other specified abnormal findings of blood chemistry: Secondary | ICD-10-CM | POA: Diagnosis not present

## 2017-11-17 DIAGNOSIS — Z0000 Encounter for general adult medical examination without abnormal findings: Secondary | ICD-10-CM

## 2017-11-17 DIAGNOSIS — R5383 Other fatigue: Secondary | ICD-10-CM

## 2017-11-17 LAB — LIPID PANEL
CHOL/HDL RATIO: 3
Cholesterol: 167 mg/dL (ref 0–200)
HDL: 66.5 mg/dL (ref >39.00)
LDL CALC: 77 mg/dL (ref 0–99)
NONHDL: 100.92
Triglycerides: 121 mg/dL (ref 0.0–149.0)
VLDL: 24.2 mg/dL (ref 0.0–40.0)

## 2017-11-17 LAB — CBC WITH DIFFERENTIAL/PLATELET
BASOS PCT: 0.7 % (ref 0.0–3.0)
Basophils Absolute: 0 10*3/uL (ref 0.0–0.1)
EOS PCT: 2 % (ref 0.0–5.0)
Eosinophils Absolute: 0.1 10*3/uL (ref 0.0–0.7)
HEMATOCRIT: 42.6 % (ref 36.0–46.0)
Hemoglobin: 14.5 g/dL (ref 12.0–15.0)
LYMPHS ABS: 1.7 10*3/uL (ref 0.7–4.0)
LYMPHS PCT: 30 % (ref 12.0–46.0)
MCHC: 34 g/dL (ref 30.0–36.0)
MCV: 88.1 fl (ref 78.0–100.0)
MONOS PCT: 7.3 % (ref 3.0–12.0)
Monocytes Absolute: 0.4 10*3/uL (ref 0.1–1.0)
NEUTROS ABS: 3.5 10*3/uL (ref 1.4–7.7)
NEUTROS PCT: 60 % (ref 43.0–77.0)
PLATELETS: 273 10*3/uL (ref 150.0–400.0)
RBC: 4.83 Mil/uL (ref 3.87–5.11)
RDW: 15 % (ref 11.5–15.5)
WBC: 5.8 10*3/uL (ref 4.0–10.5)

## 2017-11-17 LAB — COMPREHENSIVE METABOLIC PANEL
ALT: 19 U/L (ref 0–35)
AST: 21 U/L (ref 0–37)
Albumin: 4.1 g/dL (ref 3.5–5.2)
Alkaline Phosphatase: 85 U/L (ref 39–117)
BUN: 12 mg/dL (ref 6–23)
CHLORIDE: 105 meq/L (ref 96–112)
CO2: 28 meq/L (ref 19–32)
Calcium: 8.7 mg/dL (ref 8.4–10.5)
Creatinine, Ser: 0.89 mg/dL (ref 0.40–1.20)
GFR: 67.1 mL/min (ref >60.00)
GLUCOSE: 96 mg/dL (ref 70–99)
POTASSIUM: 3.9 meq/L (ref 3.5–5.1)
SODIUM: 136 meq/L (ref 135–145)
TOTAL PROTEIN: 7 g/dL (ref 6.0–8.3)
Total Bilirubin: 0.6 mg/dL (ref 0.2–1.2)

## 2017-11-17 LAB — TSH: TSH: 5.67 u[IU]/mL — ABNORMAL HIGH (ref 0.35–4.50)

## 2017-11-17 MED ORDER — ZOSTER VAC RECOMB ADJUVANTED 50 MCG/0.5ML IM SUSR: 0.5000 mL | Freq: Once | INTRAMUSCULAR | 1 refills | Status: AC

## 2017-11-17 NOTE — Patient Instructions (Signed)
THE WATER RESISTANCE  PROGRAM I MENTIONED IS CALLED "AQUALOGIX"  MAMMOGRAM HAS BEEN ORDERED   The ShingRx vaccine is now available in local pharmacies and is much more protective thant Zostavaxs,  It is therefore ADVISED for all interested adults over 50 to prevent shingles    Health Maintenance for Postmenopausal Women Menopause is a normal process in which your reproductive ability comes to an end. This process happens gradually over a span of months to years, usually between the ages of 41 and 57. Menopause is complete when you have missed 12 consecutive menstrual periods. It is important to talk with your health care provider about some of the most common conditions that affect postmenopausal women, such as heart disease, cancer, and bone loss (osteoporosis). Adopting a healthy lifestyle and getting preventive care can help to promote your health and wellness. Those actions can also lower your chances of developing some of these common conditions. What should I know about menopause? During menopause, you may experience a number of symptoms, such as:  Moderate-to-severe hot flashes.  Night sweats.  Decrease in sex drive.  Mood swings.  Headaches.  Tiredness.  Irritability.  Memory problems.  Insomnia.  Choosing to treat or not to treat menopausal changes is an individual decision that you make with your health care provider. What should I know about hormone replacement therapy and supplements? Hormone therapy products are effective for treating symptoms that are associated with menopause, such as hot flashes and night sweats. Hormone replacement carries certain risks, especially as you become older. If you are thinking about using estrogen or estrogen with progestin treatments, discuss the benefits and risks with your health care provider. What should I know about heart disease and stroke? Heart disease, heart attack, and stroke become more likely as you age. This may be due,  in part, to the hormonal changes that your body experiences during menopause. These can affect how your body processes dietary fats, triglycerides, and cholesterol. Heart attack and stroke are both medical emergencies. There are many things that you can do to help prevent heart disease and stroke:  Have your blood pressure checked at least every 1-2 years. High blood pressure causes heart disease and increases the risk of stroke.  If you are 14-73 years old, ask your health care provider if you should take aspirin to prevent a heart attack or a stroke.  Do not use any tobacco products, including cigarettes, chewing tobacco, or electronic cigarettes. If you need help quitting, ask your health care provider.  It is important to eat a healthy diet and maintain a healthy weight. ? Be sure to include plenty of vegetables, fruits, low-fat dairy products, and lean protein. ? Avoid eating foods that are high in solid fats, added sugars, or salt (sodium).  Get regular exercise. This is one of the most important things that you can do for your health. ? Try to exercise for at least 150 minutes each week. The type of exercise that you do should increase your heart rate and make you sweat. This is known as moderate-intensity exercise. ? Try to do strengthening exercises at least twice each week. Do these in addition to the moderate-intensity exercise.  Know your numbers.Ask your health care provider to check your cholesterol and your blood glucose. Continue to have your blood tested as directed by your health care provider.  What should I know about cancer screening? There are several types of cancer. Take the following steps to reduce your risk and to catch  any cancer development as early as possible. Breast Cancer  Practice breast self-awareness. ? This means understanding how your breasts normally appear and feel. ? It also means doing regular breast self-exams. Let your health care provider know  about any changes, no matter how small.  If you are 2 or older, have a clinician do a breast exam (clinical breast exam or CBE) every year. Depending on your age, family history, and medical history, it may be recommended that you also have a yearly breast X-ray (mammogram).  If you have a family history of breast cancer, talk with your health care provider about genetic screening.  If you are at high risk for breast cancer, talk with your health care provider about having an MRI and a mammogram every year.  Breast cancer (BRCA) gene test is recommended for women who have family members with BRCA-related cancers. Results of the assessment will determine the need for genetic counseling and BRCA1 and for BRCA2 testing. BRCA-related cancers include these types: ? Breast. This occurs in males or females. ? Ovarian. ? Tubal. This may also be called fallopian tube cancer. ? Cancer of the abdominal or pelvic lining (peritoneal cancer). ? Prostate. ? Pancreatic.  Cervical, Uterine, and Ovarian Cancer Your health care provider may recommend that you be screened regularly for cancer of the pelvic organs. These include your ovaries, uterus, and vagina. This screening involves a pelvic exam, which includes checking for microscopic changes to the surface of your cervix (Pap test).  For women ages 21-65, health care providers may recommend a pelvic exam and a Pap test every three years. For women ages 58-65, they may recommend the Pap test and pelvic exam, combined with testing for human papilloma virus (HPV), every five years. Some types of HPV increase your risk of cervical cancer. Testing for HPV may also be done on women of any age who have unclear Pap test results.  Other health care providers may not recommend any screening for nonpregnant women who are considered low risk for pelvic cancer and have no symptoms. Ask your health care provider if a screening pelvic exam is right for you.  If you have  had past treatment for cervical cancer or a condition that could lead to cancer, you need Pap tests and screening for cancer for at least 20 years after your treatment. If Pap tests have been discontinued for you, your risk factors (such as having a new sexual partner) need to be reassessed to determine if you should start having screenings again. Some women have medical problems that increase the chance of getting cervical cancer. In these cases, your health care provider may recommend that you have screening and Pap tests more often.  If you have a family history of uterine cancer or ovarian cancer, talk with your health care provider about genetic screening.  If you have vaginal bleeding after reaching menopause, tell your health care provider.  There are currently no reliable tests available to screen for ovarian cancer.  Lung Cancer Lung cancer screening is recommended for adults 34-17 years old who are at high risk for lung cancer because of a history of smoking. A yearly low-dose CT scan of the lungs is recommended if you:  Currently smoke.  Have a history of at least 30 pack-years of smoking and you currently smoke or have quit within the past 15 years. A pack-year is smoking an average of one pack of cigarettes per day for one year.  Yearly screening should:  Continue  until it has been 15 years since you quit.  Stop if you develop a health problem that would prevent you from having lung cancer treatment.  Colorectal Cancer  This type of cancer can be detected and can often be prevented.  Routine colorectal cancer screening usually begins at age 37 and continues through age 62.  If you have risk factors for colon cancer, your health care provider may recommend that you be screened at an earlier age.  If you have a family history of colorectal cancer, talk with your health care provider about genetic screening.  Your health care provider may also recommend using home test kits  to check for hidden blood in your stool.  A small camera at the end of a tube can be used to examine your colon directly (sigmoidoscopy or colonoscopy). This is done to check for the earliest forms of colorectal cancer.  Direct examination of the colon should be repeated every 5-10 years until age 43. However, if early forms of precancerous polyps or small growths are found or if you have a family history or genetic risk for colorectal cancer, you may need to be screened more often.  Skin Cancer  Check your skin from head to toe regularly.  Monitor any moles. Be sure to tell your health care provider: ? About any new moles or changes in moles, especially if there is a change in a mole's shape or color. ? If you have a mole that is larger than the size of a pencil eraser.  If any of your family members has a history of skin cancer, especially at a young age, talk with your health care provider about genetic screening.  Always use sunscreen. Apply sunscreen liberally and repeatedly throughout the day.  Whenever you are outside, protect yourself by wearing long sleeves, pants, a wide-brimmed hat, and sunglasses.  What should I know about osteoporosis? Osteoporosis is a condition in which bone destruction happens more quickly than new bone creation. After menopause, you may be at an increased risk for osteoporosis. To help prevent osteoporosis or the bone fractures that can happen because of osteoporosis, the following is recommended:  If you are 77-76 years old, get at least 1,000 mg of calcium and at least 600 mg of vitamin D per day.  If you are older than age 43 but younger than age 9, get at least 1,200 mg of calcium and at least 600 mg of vitamin D per day.  If you are older than age 64, get at least 1,200 mg of calcium and at least 800 mg of vitamin D per day.  Smoking and excessive alcohol intake increase the risk of osteoporosis. Eat foods that are rich in calcium and vitamin D,  and do weight-bearing exercises several times each week as directed by your health care provider. What should I know about how menopause affects my mental health? Depression may occur at any age, but it is more common as you become older. Common symptoms of depression include:  Low or sad mood.  Changes in sleep patterns.  Changes in appetite or eating patterns.  Feeling an overall lack of motivation or enjoyment of activities that you previously enjoyed.  Frequent crying spells.  Talk with your health care provider if you think that you are experiencing depression. What should I know about immunizations? It is important that you get and maintain your immunizations. These include:  Tetanus, diphtheria, and pertussis (Tdap) booster vaccine.  Influenza every year before the flu  season begins.  Pneumonia vaccine.  Shingles vaccine.  Your health care provider may also recommend other immunizations. This information is not intended to replace advice given to you by your health care provider. Make sure you discuss any questions you have with your health care provider. Document Released: 09/17/2005 Document Revised: 02/13/2016 Document Reviewed: 04/29/2015 [employer] Interactive Patient Education  2018 [employer]  

## 2017-11-17 NOTE — Progress Notes (Addendum)
Patient ID: Joanne Taylor, female    DOB: 1950-10-06  Age: 67 y.o. MRN: [id number]  The patient is here for annual PREVENTIVE examination and management of other chronic and acute problems.  LAST SEEN ONE YEAR AGO  Some right sided back spasm,,  From mid thoracic lateral to spine , no radiation     The risk factors are reflected in the social history.  The roster of all physicians providing medical care to patient - is listed in the Snapshot section of the chart.  Activities of daily living:  The patient is 100% independent in all ADLs: dressing, toileting, feeding as well as independent mobility  Home safety : The patient has smoke detectors in the home. They wear seatbelts.  There are no firearms at home. There is no violence in the home.   There is no risks for hepatitis, STDs or HIV. There is no   history of blood transfusion. They have no travel history to infectious disease endemic areas of the world.  The patient has seen their dentist in the last six month. They have seen their eye doctor in the last year. She denies hearing difficulty with regard to whispered voices and some television programs.  They have deferred audiologic testing in the last year.  She does not  have excessive sun exposure. Discussed the need for sun protection: hats, long sleeves and use of sunscreen if there is significant sun exposure.   Diet: the importance of a healthy diet is discussed. They do have a healthy diet.  The benefits of regular aerobic exercise were discussed. She participatesin Pilates 2 days a week and water aerobics planned to open in April  2 times  per week ,  6   0 minutes.   Depression screen: there are no signs or vegative symptoms of depression- irritability, change in appetite, anhedonia, sadness/tearfullness.  Cognitive assessment: the patient manages all their financial and personal affairs and is actively engaged. They could relate day,date,year and events; recalled 2/3  objects at 3 minutes; performed clock-face test normally.  The following portions of the patient's history were reviewed and updated as appropriate: allergies, current medications, past family history, past medical history,  past surgical history, past social history  and problem list.  Visual acuity was not assessed per patient preference since she has regular follow up with her ophthalmologist. Hearing and body mass index were assessed and reviewed.   During the course of the visit the patient was educated and counseled about appropriate screening and preventive services including : fall prevention , diabetes screening, nutrition counseling, colorectal cancer screening, and recommended immunizations.    CC: The primary encounter diagnosis was Breast cancer screening. Diagnoses of Hyperlipidemia LDL goal <160, Fatigue, unspecified type, Visit for preventive health examination, and Osteopenia, unspecified location were also pertinent to this visit.  History Joanne Taylor has a past medical history of Osteopenia.   She has a past surgical history that includes Tubal ligation; Colonoscopy; and Colonoscopy with propofol (N/A, 01/09/2016).   Her family history includes Cancer in her father; Hyperlipidemia in her father; Hypertension in her mother; Mental illness in her father and mother.She reports that she quit smoking about 45 years ago. Her smoking use included cigarettes. She has never used smokeless tobacco. She reports that she drinks about 2.5 oz of alcohol per week. She reports that she does not use drugs.  Outpatient Medications Prior to Visit  Medication Sig Dispense Refill  . Calcium Carb-Cholecalciferol (CALCIUM 1000 + D) 1000-800 MG-UNIT  TABS Take by mouth.    . estradiol (ESTRACE) 0.5 MG tablet TAKE ONE TABLET BY MOUTH DAILY 30 tablet 3  . fluticasone (FLONASE) 50 MCG/ACT nasal spray Place into the nose.    . Multiple Vitamin (MULTIVITAMIN) tablet Take 1 tablet by mouth daily.    .  Probiotic Product (PROBIOTIC FORMULA PO) Take by mouth.    . progesterone (PROMETRIUM) 100 MG capsule TAKE ONE CAPSULE BY MOUTH DAILY 30 capsule 3   No facility-administered medications prior to visit.     Review of Systems   Patient denies headache, fevers, malaise, unintentional weight loss, skin rash, eye pain, sinus congestion and sinus pain, sore throat, dysphagia,  hemoptysis , cough, dyspnea, wheezing, chest pain, palpitations, orthopnea, edema, abdominal pain, nausea, melena, diarrhea, constipation, flank pain, dysuria, hematuria, urinary  Frequency, nocturia, numbness, tingling, seizures,  Focal weakness, Loss of consciousness,  Tremor, insomnia, depression, anxiety, and suicidal ideation.      Objective:  BP 122/82 (BP Location: Left Arm, Patient Position: Sitting, Cuff Size: Normal)   Pulse 76   Temp (!) 97.5 F (36.4 C)   Wt 203 lb 6.4 oz (92.3 kg)   SpO2 98%   BMI 31.39 kg/m   Physical Exam   General appearance: alert, cooperative and appears stated age Head: Normocephalic, without obvious abnormality, atraumatic Eyes: conjunctivae/corneas clear. PERRL, EOM's intact. Fundi benign. Ears: normal TM's and external ear canals both ears Nose: Nares normal. Septum midline. Mucosa normal. No drainage or sinus tenderness. Throat: lips, mucosa, and tongue normal; teeth and gums normal Neck: no adenopathy, no carotid bruit, no JVD, supple, symmetrical, trachea midline and thyroid not enlarged, symmetric, no tenderness/mass/nodules Lungs: clear to auscultation bilaterally Breasts: normal appearance, no masses or tenderness Heart: regular rate and rhythm, S1, S2 normal, no murmur, click, rub or gallop Abdomen: soft, non-tender; bowel sounds normal; no masses,  no organomegaly Extremities: extremities normal, atraumatic, no cyanosis or edema Pulses: 2+ and symmetric Skin: Skin color, texture, turgor normal. No rashes or lesions Neurologic: Alert and oriented X 3, normal strength  and tone. Normal symmetric reflexes. Normal coordination and gait.      Assessment & Plan:   Problem List Items Addressed This Visit    Visit for preventive health examination    Annual comprehensive preventive exam was done as well as an evaluation and management of chronic conditions .  During the course of the visit the patient was educated and counseled about appropriate screening and preventive services including :  diabetes screening, lipid analysis with projected  10 year  risk for CAD , nutrition counseling, breast, cervical and colorectal cancer screening, and recommended immunizations.  Printed recommendations for health maintenance screenings was given      Osteopenia    DEXA reviewed . Discussed the current controversies surrounding the risks and benefits of calcium supplementation.  Encouraged her to increase dietary calcium through natural foods including almond/coconut milk       Other Visit Diagnoses    Breast cancer screening    -  Primary   Relevant Orders   MM 3D SCREEN BREAST BILATERAL   Hyperlipidemia LDL goal <160       Relevant Orders   Lipid panel (Completed)   Fatigue, unspecified type       Relevant Orders   Comprehensive metabolic panel (Completed)   TSH (Completed)   CBC with Differential/Platelet (Completed)      I am having Lavenia Atlas "Cathy" start on Zoster Vaccine Adjuvanted. I am also having  her maintain her multivitamin, Calcium Carb-Cholecalciferol, Probiotic Product (PROBIOTIC FORMULA PO), fluticasone, estradiol, and progesterone.  Meds ordered this encounter  Medications  . Zoster Vaccine Adjuvanted [facility]) injection    Sig: Inject 0.5 mLs into the muscle once for 1 dose.    Dispense:  1 each    Refill:  1    There are no discontinued medications.  Follow-up: Return in about 1 year (around 11/18/2018) for cpe.   Crecencio Mc, MD

## 2017-11-19 NOTE — Assessment & Plan Note (Signed)
DEXA reviewed . Discussed the current controversies surrounding the risks and benefits of calcium supplementation.  Encouraged her to increase dietary calcium through natural foods including almond/coconut milk

## 2017-11-19 NOTE — Assessment & Plan Note (Signed)
Annual comprehensive preventive exam was done as well as an evaluation and management of chronic conditions .  During the course of the visit the patient was educated and counseled about appropriate screening and preventive services including :  diabetes screening, lipid analysis with projected  10 year  risk for CAD , nutrition counseling, breast, cervical and colorectal cancer screening, and recommended immunizations.  Printed recommendations for health maintenance screenings was given 

## 2017-11-20 DIAGNOSIS — R7989 Other specified abnormal findings of blood chemistry: Secondary | ICD-10-CM | POA: Insufficient documentation

## 2017-11-20 DIAGNOSIS — E039 Hypothyroidism, unspecified: Secondary | ICD-10-CM | POA: Insufficient documentation

## 2017-11-20 NOTE — Addendum Note (Signed)
Addended by: Crecencio Mc on: 11/20/2017 09:45 AM   Modules accepted: Orders

## 2017-12-29 ENCOUNTER — Other Ambulatory Visit (INDEPENDENT_AMBULATORY_CARE_PROVIDER_SITE_OTHER): Payer: Medicare Other

## 2017-12-29 DIAGNOSIS — R7989 Other specified abnormal findings of blood chemistry: Secondary | ICD-10-CM | POA: Diagnosis not present

## 2017-12-29 LAB — TSH: TSH: 7.34 u[IU]/mL — ABNORMAL HIGH (ref 0.35–4.50)

## 2017-12-29 LAB — T4, FREE: FREE T4: 0.76 ng/dL (ref 0.60–1.60)

## 2017-12-31 ENCOUNTER — Encounter: Payer: Self-pay | Admitting: Internal Medicine

## 2018-01-04 ENCOUNTER — Other Ambulatory Visit: Payer: Self-pay | Admitting: Internal Medicine

## 2018-01-04 DIAGNOSIS — E039 Hypothyroidism, unspecified: Secondary | ICD-10-CM

## 2018-01-04 DIAGNOSIS — R7989 Other specified abnormal findings of blood chemistry: Secondary | ICD-10-CM

## 2018-01-04 MED ORDER — LEVOTHYROXINE SODIUM 50 MCG PO TABS: 50.0000 ug | ORAL_TABLET | Freq: Every day | ORAL | 0 refills | Status: DC

## 2018-01-04 NOTE — Progress Notes (Signed)
othroxine

## 2018-01-23 ENCOUNTER — Telehealth: Payer: Self-pay | Admitting: Internal Medicine

## 2018-01-23 NOTE — Telephone Encounter (Signed)
Lab order placed.

## 2018-01-23 NOTE — Telephone Encounter (Unsigned)
Copied from [place] [id number]. Topic: Quick Communication - See Telephone Encounter >> Jan 23, 2018 10:49 AM Neva Seat wrote: July 15 - Rx Labs check.  Orders will need to be placed for pt's labs.

## 2018-02-10 ENCOUNTER — Other Ambulatory Visit: Payer: Self-pay | Admitting: Internal Medicine

## 2018-02-20 ENCOUNTER — Other Ambulatory Visit (INDEPENDENT_AMBULATORY_CARE_PROVIDER_SITE_OTHER): Payer: Medicare Other

## 2018-02-20 DIAGNOSIS — E039 Hypothyroidism, unspecified: Secondary | ICD-10-CM

## 2018-02-20 LAB — T4, FREE: FREE T4: 1.1 ng/dL (ref 0.60–1.60)

## 2018-02-20 LAB — TSH: TSH: 3.25 u[IU]/mL (ref 0.35–4.50)

## 2018-03-06 ENCOUNTER — Ambulatory Visit
Admission: RE | Admit: 2018-03-06 | Discharge: 2018-03-06 | Disposition: A | Discharge status: Home / Self Care | Payer: Medicare Other | Source: Ambulatory Visit | Attending: Internal Medicine | Admitting: Internal Medicine

## 2018-03-06 DIAGNOSIS — Z1231 Encounter for screening mammogram for malignant neoplasm of breast: Secondary | ICD-10-CM | POA: Diagnosis not present

## 2018-03-06 DIAGNOSIS — Z1239 Encounter for other screening for malignant neoplasm of breast: Secondary | ICD-10-CM

## 2018-04-04 ENCOUNTER — Other Ambulatory Visit: Payer: Self-pay | Admitting: Internal Medicine

## 2018-05-12 ENCOUNTER — Other Ambulatory Visit: Payer: Self-pay | Admitting: Internal Medicine

## 2018-07-02 ENCOUNTER — Other Ambulatory Visit: Payer: Self-pay | Admitting: Internal Medicine

## 2018-07-14 ENCOUNTER — Other Ambulatory Visit: Payer: Self-pay

## 2018-07-14 MED ORDER — ESTRADIOL 0.5 MG PO TABS: 0.5000 mg | ORAL_TABLET | Freq: Every day | ORAL | 1 refills | Status: DC

## 2018-07-14 MED ORDER — PROGESTERONE MICRONIZED 100 MG PO CAPS: 100.0000 mg | ORAL_CAPSULE | Freq: Every day | ORAL | 1 refills | Status: DC

## 2018-07-18 ENCOUNTER — Emergency Department
Admission: EM | Admit: 2018-07-18 | Discharge: 2018-07-18 | Disposition: A | Discharge status: Home / Self Care | Payer: Medicare Other | Attending: Emergency Medicine | Admitting: Emergency Medicine

## 2018-07-18 ENCOUNTER — Other Ambulatory Visit: Payer: Self-pay

## 2018-07-18 ENCOUNTER — Encounter: Payer: Self-pay | Admitting: Emergency Medicine

## 2018-07-18 ENCOUNTER — Emergency Department: Payer: Medicare Other

## 2018-07-18 DIAGNOSIS — Y929 Unspecified place or not applicable: Secondary | ICD-10-CM | POA: Diagnosis not present

## 2018-07-18 DIAGNOSIS — Y999 Unspecified external cause status: Secondary | ICD-10-CM | POA: Diagnosis not present

## 2018-07-18 DIAGNOSIS — Y9301 Activity, walking, marching and hiking: Secondary | ICD-10-CM | POA: Insufficient documentation

## 2018-07-18 DIAGNOSIS — Z79899 Other long term (current) drug therapy: Secondary | ICD-10-CM | POA: Insufficient documentation

## 2018-07-18 DIAGNOSIS — Z87891 Personal history of nicotine dependence: Secondary | ICD-10-CM | POA: Insufficient documentation

## 2018-07-18 DIAGNOSIS — S83411A Sprain of medial collateral ligament of right knee, initial encounter: Secondary | ICD-10-CM | POA: Diagnosis not present

## 2018-07-18 DIAGNOSIS — M79604 Pain in right leg: Secondary | ICD-10-CM | POA: Diagnosis present

## 2018-07-18 DIAGNOSIS — Y33XXXA Other specified events, undetermined intent, initial encounter: Secondary | ICD-10-CM | POA: Diagnosis not present

## 2018-07-18 NOTE — ED Provider Notes (Signed)
[facility] Emergency Department Provider Note ____________________________________________  Time seen: 1155  I have reviewed the triage vital signs and the nursing notes.  HISTORY  Chief Complaint  Knee Pain  HPI Joanne Taylor is a 67 y.o. female who presents to the ED accompanied by her husband, for evaluation of right knee pain.  Patient describes pain to the medial aspect of her joint, after her knee gave out this morning while walking down the stairs.  She describes having troubles recently with the right knee, including medial knee pain and stiffness.  She denies any previous chronic or ongoing knee pains or recent knee surgeries.  She does have a history of osteopenia and a previous meniscal tear and repair on the left knee.  She denies any other injury related to the fall.  Past Medical History:  Diagnosis Date  . Osteopenia     Patient Active Problem List   Diagnosis Date Noted  . Abnormal TSH 11/20/2017  . Allergic rhinitis due to [employer] house dust mite 11/18/2016  . Seborrheic keratosis 05/29/2015  . Osteopenia 05/29/2015  . Screening for cervical cancer 10/20/2012  . Visit for preventive health examination 08/07/2012  . Overweight (BMI 25.0-29.9) 08/07/2012    Past Surgical History:  Procedure Laterality Date  . COLONOSCOPY    . COLONOSCOPY WITH PROPOFOL N/A 01/09/2016   Procedure: COLONOSCOPY WITH PROPOFOL;  Surgeon: Lollie Sails, MD;  Location: [facility] ENDOSCOPY;  Service: Endoscopy;  Laterality: N/A;  . TUBAL LIGATION      Prior to Admission medications   Medication Sig Start Date End Date Taking? Authorizing Provider  Calcium Carb-Cholecalciferol (CALCIUM 1000 + D) 1000-800 MG-UNIT TABS Take by mouth.    [provider]  estradiol (ESTRACE) 0.5 MG tablet Take 1 tablet (0.5 mg total) by mouth daily. 07/14/18   Crecencio Mc, MD  fluticasone (FLONASE) 50 MCG/ACT nasal spray Place into the nose.    [provider]  levothyroxine (SYNTHROID, LEVOTHROID) 50 MCG tablet TAKE ONE TABLET BY MOUTH DAILY 07/03/18   Crecencio Mc, MD  Multiple Vitamin (MULTIVITAMIN) tablet Take 1 tablet by mouth daily.    [provider]  Probiotic Product (PROBIOTIC FORMULA PO) Take by mouth.    [provider]  progesterone (PROMETRIUM) 100 MG capsule Take 1 capsule (100 mg total) by mouth daily. 07/14/18   Crecencio Mc, MD    Allergies Patient has no known allergies.  Family History  Problem Relation Age of Onset  . Hypertension Mother   . Mental illness Mother        depressive disorder  . Cancer Father        prostate  . Mental illness Father        lewy body Dementia  . Hyperlipidemia Father   . Breast cancer Neg Hx     Social History Social History   Tobacco Use  . Smoking status: Former Smoker    Types: Cigarettes    Last attempt to quit: 02/07/1972    Years since quitting: 46.4  . Smokeless tobacco: Never Used  Substance Use Topics  . Alcohol use: Yes    Alcohol/week: 5.0 standard drinks    Types: 5 Standard drinks or equivalent per week  . Drug use: No    Review of Systems  Constitutional: Negative for fever. Cardiovascular: Negative for chest pain. Respiratory: Negative for shortness of breath. Musculoskeletal: Negative for back pain.  Right knee pain as above. Skin: Negative for rash. Neurological: Negative  for headaches, focal weakness or numbness. ____________________________________________  PHYSICAL EXAM:  VITAL SIGNS: ED Triage Vitals  Enc Vitals Group     BP 07/18/18 1018 (!) 179/93     Pulse Rate 07/18/18 1018 71     Resp 07/18/18 1018 16     Temp 07/18/18 1018 97.8 F (36.6 C)     Temp Source 07/18/18 1018 Oral     SpO2 07/18/18 1018 94 %     Weight 07/18/18 1019 190 lb (86.2 kg)     Height 07/18/18 1019 5\' 8"  (1.727 m)     Head Circumference --      Peak Flow --      Pain Score 07/18/18 1019 1     Pain Loc --      Pain Edu? --      Excl. in  [place]? --     Constitutional: Alert and oriented. Well appearing and in no distress. Head: Normocephalic and atraumatic. Eyes: Conjunctivae are normal. Normal extraocular movements Cardiovascular: Normal rate, regular rhythm. Normal distal pulses. Respiratory: Normal respiratory effort. No wheezes/rales/rhonchi. Musculoskeletal: Right knee without obvious deformity, dislocation, or effusion.  Patient with normal active range of motion to the knee with flexion and extension.  She is tender to palpation over the medial aspect of the joint over both the medial femoral condyle and the medial tibial plateau.  Valgus stress to the right knee elicits pain to the medial joint space.  No popliteal space fullness is elicited.  Negative anterior/posterior drawer.  No calf or Achilles tenderness is noted distally.  Nontender with normal range of motion in all extremities. Normal speech and language. No gross focal neurologic deficits are appreciated. Skin:  Skin is warm, dry and intact. No rash noted. ____________________________________________   RADIOLOGY  Right knee IMPRESSION: 1. No acute fracture or joint effusion. 2. Cannot exclude small loose bodies. ____________________________________________  PROCEDURES  Procedures Ace bandage Knee immobilizer ____________________________________________  INITIAL IMPRESSION / ASSESSMENT AND PLAN / ED COURSE  Patient with ED evaluation of knee pain medially after a mechanical fall this morning.  Patient's x-ray is negative for any acute fracture or dislocation.  Her clinical picture is consistent with a probable medial collateral ligament strain.  Patient is placed in a knee immobilizer for support.  She is referred to orthopedics for ongoing evaluation and management.  She is advised to take over-the-counter Aleve for pain inflammation.  She will follow-up with Ortho as suggested. ____________________________________________  FINAL CLINICAL IMPRESSION(S) /  ED DIAGNOSES  Final diagnoses:  Sprain of medial collateral ligament of right knee, initial encounter      Melvenia Needles, PA-C 07/18/18 1655    Earleen Newport, MD 07/19/18 [phone]

## 2018-07-18 NOTE — ED Triage Notes (Signed)
Pt reports trouble with her right knee for some time but this am was walking down the stairs and it gave out.

## 2018-07-18 NOTE — Discharge Instructions (Addendum)
Your exam is consistent with a knee sprain. You appear to have a medial collateral ligament sprain. Your x-ray is negative for fracture or dislocation. Take OTC Aleve for pain & inflammation. Follow-up with Dr. Harlow Mares for ongoing symptoms.

## 2018-07-18 NOTE — ED Notes (Signed)
See triage note  Presents with right knee pain  States pain started awhile ago   But states her knee gave out while walking down steps

## 2018-09-07 ENCOUNTER — Other Ambulatory Visit: Payer: Self-pay | Admitting: Internal Medicine

## 2018-09-19 ENCOUNTER — Ambulatory Visit: Payer: Medicare Other

## 2018-09-25 ENCOUNTER — Other Ambulatory Visit: Payer: Self-pay | Admitting: Internal Medicine

## 2018-10-02 ENCOUNTER — Ambulatory Visit: Payer: Medicare Other

## 2018-10-09 ENCOUNTER — Ambulatory Visit (INDEPENDENT_AMBULATORY_CARE_PROVIDER_SITE_OTHER): Payer: Medicare Other

## 2018-10-09 VITALS — BP 122/72 | HR 74 | Temp 98.1°F | Resp 15 | Ht 67.5 in | Wt 199.0 lb

## 2018-10-09 DIAGNOSIS — Z0000 Encounter for general adult medical examination without abnormal findings: Secondary | ICD-10-CM | POA: Diagnosis not present

## 2018-10-09 NOTE — Patient Instructions (Addendum)
  Joanne Taylor , Thank you for taking time to come for your Medicare Wellness Visit. I appreciate your ongoing commitment to your health goals. Please review the following plan we discussed and let me know if I can assist you in the future.   These are the goals we discussed: Goals      Patient Stated   . Increase physical activity (pt-stated)     As tolerated       This is a list of the screening recommended for you and due dates:  Health Maintenance  Topic Date Due  . Mammogram  03/07/2019  . Tetanus Vaccine  02/08/2024  . Colon Cancer Screening  01/08/2026  . Flu Shot  Completed  . DEXA scan (bone density measurement)  Completed  .  Hepatitis C: One time screening is recommended by Center for Disease Control  (CDC) for  adults born from [birth year] through 1966.   Completed  . Pneumonia vaccines  Completed

## 2018-10-09 NOTE — Progress Notes (Signed)
Subjective:   Joanne Taylor is a 68 y.o. female who presents for Medicare Annual (Subsequent) preventive examination.  Review of Systems:  No ROS.  Medicare Wellness Visit. Additional risk factors are reflected in the social history. Cardiac Risk Factors include: advanced age (>60men, >67 women)     Objective:     Vitals: BP 122/72 (BP Location: Left Arm, Patient Position: Sitting, Cuff Size: Normal)   Pulse 74   Temp 98.1 F (36.7 C) (Oral)   Resp 15   Ht 5' 7.5" (1.715 m)   Wt 199 lb (90.3 kg)   SpO2 97%   BMI 30.71 kg/m   Body mass index is 30.71 kg/m.  Advanced Directives 10/09/2018 07/18/2018 09/16/2017  Does Patient Have a Medical Advance Directive? Yes No No  Type of Paramedic of [place];Living will - -  Does patient want to make changes to medical advance directive? No - Patient declined - -  Copy of [place] in Chart? No - copy requested - -  Would patient like information on creating a medical advance directive? - - No - Patient declined    Tobacco Social History   Tobacco Use  Smoking Status Former Smoker  . Types: Cigarettes  . Last attempt to quit: 02/07/1972  . Years since quitting: 46.7  Smokeless Tobacco Never Used     Counseling given: Not Answered   Clinical Intake:  Pre-visit preparation completed: Yes        Diabetes: No  How often do you need to have someone help you when you read instructions, pamphlets, or other written materials from your doctor or pharmacy?: 1 - Never  Interpreter Needed?: No     Past Medical History:  Diagnosis Date  . Osteopenia    Past Surgical History:  Procedure Laterality Date  . COLONOSCOPY    . COLONOSCOPY WITH PROPOFOL N/A 01/09/2016   Procedure: COLONOSCOPY WITH PROPOFOL;  Surgeon: Lollie Sails, MD;  Location: [facility] ENDOSCOPY;  Service: Endoscopy;  Laterality: N/A;  . TUBAL LIGATION     Family History  Problem Relation Age of Onset  .  Hypertension Mother   . Mental illness Mother        depressive disorder  . Congestive Heart Failure Mother   . Cancer Father        prostate  . Mental illness Father        lewy body Dementia  . Hyperlipidemia Father   . Breast cancer Neg Hx    Social History   Socioeconomic History  . Marital status: Married    Spouse name: Not on file  . Number of children: Not on file  . Years of education: Not on file  . Highest education level: Not on file  Occupational History  . Occupation: Asst Garment/textile technologist: acc    Comment: at [employer]  . Financial resource strain: Not hard at all  . Food insecurity:    Worry: Never true    Inability: Never true  . Transportation needs:    Medical: No    Non-medical: No  Tobacco Use  . Smoking status: Former Smoker    Types: Cigarettes    Last attempt to quit: 02/07/1972    Years since quitting: 46.7  . Smokeless tobacco: Never Used  Substance and Sexual Activity  . Alcohol use: Yes    Alcohol/week: 5.0 standard drinks    Types: 5 Standard drinks or equivalent per week  .  Drug use: No  . Sexual activity: Not on file  Lifestyle  . Physical activity:    Days per week: 2 days    Minutes per session: 50 min  . Stress: Not at all  Relationships  . Social connections:    Talks on phone: Not on file    Gets together: Not on file    Attends religious service: Not on file    Active member of club or organization: Not on file    Attends meetings of clubs or organizations: Not on file    Relationship status: Not on file  Other Topics Concern  . Not on file  Social History Narrative  . Not on file    Outpatient Encounter Medications as of 10/09/2018  Medication Sig  . Calcium Carb-Cholecalciferol (CALCIUM 1000 + D) 1000-800 MG-UNIT TABS Take by mouth.  . estradiol (ESTRACE) 0.5 MG tablet TAKE ONE TABLET BY MOUTH DAILY  . fluticasone (FLONASE) 50 MCG/ACT nasal spray Place into the nose.  . levothyroxine (SYNTHROID, LEVOTHROID)  50 MCG tablet TAKE ONE TABLET BY MOUTH DAILY  . Multiple Vitamin (MULTIVITAMIN) tablet Take 1 tablet by mouth daily.  . Probiotic Product (PROBIOTIC FORMULA PO) Take by mouth.  . progesterone (PROMETRIUM) 100 MG capsule TAKE ONE CAPSULE BY MOUTH DAILY   No facility-administered encounter medications on file as of 10/09/2018.     Activities of Daily Living In your present state of health, do you have any difficulty performing the following activities: 10/09/2018  Hearing? N  Vision? N  Difficulty concentrating or making decisions? N  Walking or climbing stairs? N  Dressing or bathing? N  Doing errands, shopping? N  Preparing Food and eating ? N  Using the Toilet? N  In the past six months, have you accidently leaked urine? Y  Comment Managed with daily pad   Do you have problems with loss of bowel control? N  Managing your Medications? N  Managing your Finances? N  Housekeeping or managing your Housekeeping? N  Some recent data might be hidden    Patient Care Team: Crecencio Mc, MD as PCP - General (Internal Medicine)    Assessment:   This is a routine wellness examination for Kit.  Health Screenings  Mammogram -03/06/18 Colonoscopy -01/09/16 Bone Density -06/11/15 Glaucoma -none reported Hearing -demonstrates normal hearing during conversation Glucose- 11/17/17 (96) Cholesterol -11/17/17 (167) Dental- every 6 months Vision-10/09/18  Social  Alcohol intake -yes, 5 per week Smoking history- former Smokers in home? none Illicit drug use? none Exercise -strength training, weights 2 days weekly, 45 min Diet -regular  Safety  Patient feels safe at home. Patient does have smoke detectors at home  Patient does wear sunscreen or protective clothing when in direct sunlight  Patient does wear seat belt when driving or riding with others.   Activities of Daily Living Patient can do their own household chores. Denies needing assistance with: driving, feeding themselves,  getting from bed to chair, getting to the toilet, bathing/showering, dressing, managing money, climbing flight of stairs, or preparing meals.   Depression Screen Patient denies losing interest in daily life, feeling hopeless, or crying easily over simple problems.   Fall Scree Patient denies being afraid of falling. No falls since she slipped in 05/2019. Followed by ortho.    Memory Screen Patient denies problems with memory, misplacing items, and is able to balance checkbook/bank accounts.  Patient is alert, normal appearance, oriented to person/place/and time. Correctly identified the president of the [place], recall of  2/3 objects, and performing simple calculations.  Patient displays appropriate judgement and can read correct time from watch face.   Immunizations The following Immunizations are up to date: Influenza, shingles, pneumonia, and tetanus.   Other Providers Patient Care Team: Crecencio Mc, MD as PCP - General (Internal Medicine)  Exercise Activities and Dietary recommendations Current Exercise Habits: Home exercise routine, Type of exercise: strength training/weights, Time (Minutes): 45, Frequency (Times/Week): 2, Weekly Exercise (Minutes/Week): 90  Goals      Patient Stated   . Increase physical activity (pt-stated)     As tolerated       Fall Risk Fall Risk  10/09/2018 09/16/2017 11/16/2016 11/10/2015 08/07/2012  Falls in the past year? 1 No Yes No No  Number falls in past yr: - - 1 - -  Injury with Fall? - - No - -   Depression Screen PHQ 2/9 Scores 10/09/2018 09/16/2017 11/16/2016 11/10/2015  PHQ - 2 Score 0 0 0 0     Cognitive Function MMSE - Mini Mental State Exam 10/09/2018 09/16/2017  Orientation to time 5 5  Orientation to Place 5 5  Registration 3 3  Attention/ Calculation 5 5  Recall 3 3  Language- name 2 objects 2 2  Language- repeat 1 1  Language- follow 3 step command 3 3  Language- read & follow direction 1 1  Write a sentence 1 1  Copy design 1 1    Total score 30 30        Immunization History  Administered Date(s) Administered  . Influenza Split 05/23/2012  . Influenza, High Dose Seasonal PF 05/24/2018  . Influenza-Unspecified 06/24/2013, 05/09/2014, 05/20/2015, 06/06/2017  . Pneumococcal Conjugate-13 05/28/2015  . Pneumococcal Polysaccharide-23 11/16/2016  . Tdap 02/07/2014  . Zoster 11/01/2013   Screening Tests Health Maintenance  Topic Date Due  . MAMMOGRAM  03/07/2019  . TETANUS/TDAP  02/08/2024  . COLONOSCOPY  01/08/2026  . INFLUENZA VACCINE  Completed  . DEXA SCAN  Completed  . Hepatitis C Screening  Completed  . PNA vac Low Risk Adult  Completed      Plan:    End of life planning; Advance aging; Advanced directives discussed. Copy of current HCPOA/Living Will requested.    I have personally reviewed and noted the following in the patient's chart:   . Medical and social history . Use of alcohol, tobacco or illicit drugs  . Current medications and supplements . Functional ability and status . Nutritional status . Physical activity . Advanced directives . List of other physicians . Hospitalizations, surgeries, and ER visits in previous 12 months . Vitals . Screenings to include cognitive, depression, and falls . Referrals and appointments  In addition, I have reviewed and discussed with patient certain preventive protocols, quality metrics, and best practice recommendations. A written personalized care plan for preventive services as well as general preventive health recommendations were provided to patient.     OBrien-Blaney, Denisa L, LPN  08/14/1094    I have reviewed the above information and agree with above.   Deborra Medina, MD

## 2018-11-20 ENCOUNTER — Encounter: Payer: Medicare Other | Admitting: Internal Medicine

## 2018-12-09 ENCOUNTER — Other Ambulatory Visit: Payer: Self-pay | Admitting: Internal Medicine

## 2018-12-25 ENCOUNTER — Other Ambulatory Visit: Payer: Self-pay | Admitting: Internal Medicine

## 2019-01-08 ENCOUNTER — Other Ambulatory Visit: Payer: Self-pay

## 2019-01-09 ENCOUNTER — Ambulatory Visit (INDEPENDENT_AMBULATORY_CARE_PROVIDER_SITE_OTHER): Payer: Medicare Other | Admitting: Internal Medicine

## 2019-01-09 ENCOUNTER — Other Ambulatory Visit: Payer: Self-pay

## 2019-01-09 ENCOUNTER — Encounter: Payer: Self-pay | Admitting: Internal Medicine

## 2019-01-09 VITALS — BP 130/86 | HR 91 | Temp 98.1°F | Resp 14 | Ht 67.5 in | Wt 198.6 lb

## 2019-01-09 DIAGNOSIS — E039 Hypothyroidism, unspecified: Secondary | ICD-10-CM

## 2019-01-09 DIAGNOSIS — E663 Overweight: Secondary | ICD-10-CM | POA: Diagnosis not present

## 2019-01-09 DIAGNOSIS — Z0000 Encounter for general adult medical examination without abnormal findings: Secondary | ICD-10-CM | POA: Diagnosis not present

## 2019-01-09 DIAGNOSIS — E669 Obesity, unspecified: Secondary | ICD-10-CM

## 2019-01-09 NOTE — Patient Instructions (Signed)
Your annual mammogram has been ordered and is due o or after March 06 2019.  You are encouraged (required) to call to make your appointment at [facility]  Please make an appointment for fasting labs    Health Maintenance for Postmenopausal Women Menopause is a normal process in which your reproductive ability comes to an end. This process happens gradually over a span of months to years, usually between the ages of 14 and 15. Menopause is complete when you have missed 12 consecutive menstrual periods. It is important to talk with your health care provider about some of the most common conditions that affect postmenopausal women, such as heart disease, cancer, and bone loss (osteoporosis). Adopting a healthy lifestyle and getting preventive care can help to promote your health and wellness. Those actions can also lower your chances of developing some of these common conditions. What should I know about menopause? During menopause, you may experience a number of symptoms, such as:  Moderate-to-severe hot flashes.  Night sweats.  Decrease in sex drive.  Mood swings.  Headaches.  Tiredness.  Irritability.  Memory problems.  Insomnia. Choosing to treat or not to treat menopausal changes is an individual decision that you make with your health care provider. What should I know about hormone replacement therapy and supplements? Hormone therapy products are effective for treating symptoms that are associated with menopause, such as hot flashes and night sweats. Hormone replacement carries certain risks, especially as you become older. If you are thinking about using estrogen or estrogen with progestin treatments, discuss the benefits and risks with your health care provider. What should I know about heart disease and stroke? Heart disease, heart attack, and stroke become more likely as you age. This may be due, in part, to the hormonal changes that your body experiences during  menopause. These can affect how your body processes dietary fats, triglycerides, and cholesterol. Heart attack and stroke are both medical emergencies. There are many things that you can do to help prevent heart disease and stroke:  Have your blood pressure checked at least every 1-2 years. High blood pressure causes heart disease and increases the risk of stroke.  If you are 20-40 years old, ask your health care provider if you should take aspirin to prevent a heart attack or a stroke.  Do not use any tobacco products, including cigarettes, chewing tobacco, or electronic cigarettes. If you need help quitting, ask your health care provider.  It is important to eat a healthy diet and maintain a healthy weight. ? Be sure to include plenty of vegetables, fruits, low-fat dairy products, and lean protein. ? Avoid eating foods that are high in solid fats, added sugars, or salt (sodium).  Get regular exercise. This is one of the most important things that you can do for your health. ? Try to exercise for at least 150 minutes each week. The type of exercise that you do should increase your heart rate and make you sweat. This is known as moderate-intensity exercise. ? Try to do strengthening exercises at least twice each week. Do these in addition to the moderate-intensity exercise.  Know your numbers.Ask your health care provider to check your cholesterol and your blood glucose. Continue to have your blood tested as directed by your health care provider.  What should I know about cancer screening? There are several types of cancer. Take the following steps to reduce your risk and to catch any cancer development as early as possible. Breast Cancer  Practice breast self-awareness. ? This means understanding how your breasts normally appear and feel. ? It also means doing regular breast self-exams. Let your health care provider know about any changes, no matter how small.  If you are 85 or older,  have a clinician do a breast exam (clinical breast exam or CBE) every year. Depending on your age, family history, and medical history, it may be recommended that you also have a yearly breast X-ray (mammogram).  If you have a family history of breast cancer, talk with your health care provider about genetic screening.  If you are at high risk for breast cancer, talk with your health care provider about having an MRI and a mammogram every year.  Breast cancer (BRCA) gene test is recommended for women who have family members with BRCA-related cancers. Results of the assessment will determine the need for genetic counseling and BRCA1 and for BRCA2 testing. BRCA-related cancers include these types: ? Breast. This occurs in males or females. ? Ovarian. ? Tubal. This may also be called fallopian tube cancer. ? Cancer of the abdominal or pelvic lining (peritoneal cancer). ? Prostate. ? Pancreatic. Cervical, Uterine, and Ovarian Cancer Your health care provider may recommend that you be screened regularly for cancer of the pelvic organs. These include your ovaries, uterus, and vagina. This screening involves a pelvic exam, which includes checking for microscopic changes to the surface of your cervix (Pap test).  For women ages 21-65, health care providers may recommend a pelvic exam and a Pap test every three years. For women ages 18-65, they may recommend the Pap test and pelvic exam, combined with testing for human papilloma virus (HPV), every five years. Some types of HPV increase your risk of cervical cancer. Testing for HPV may also be done on women of any age who have unclear Pap test results.  Other health care providers may not recommend any screening for nonpregnant women who are considered low risk for pelvic cancer and have no symptoms. Ask your health care provider if a screening pelvic exam is right for you.  If you have had past treatment for cervical cancer or a condition that could lead  to cancer, you need Pap tests and screening for cancer for at least 20 years after your treatment. If Pap tests have been discontinued for you, your risk factors (such as having a new sexual partner) need to be reassessed to determine if you should start having screenings again. Some women have medical problems that increase the chance of getting cervical cancer. In these cases, your health care provider may recommend that you have screening and Pap tests more often.  If you have a family history of uterine cancer or ovarian cancer, talk with your health care provider about genetic screening.  If you have vaginal bleeding after reaching menopause, tell your health care provider.  There are currently no reliable tests available to screen for ovarian cancer. Lung Cancer Lung cancer screening is recommended for adults 62-61 years old who are at high risk for lung cancer because of a history of smoking. A yearly low-dose CT scan of the lungs is recommended if you:  Currently smoke.  Have a history of at least 30 pack-years of smoking and you currently smoke or have quit within the past 15 years. A pack-year is smoking an average of one pack of cigarettes per day for one year. Yearly screening should:  Continue until it has been 15 years since you quit.  Stop if you  develop a health problem that would prevent you from having lung cancer treatment. Colorectal Cancer  This type of cancer can be detected and can often be prevented.  Routine colorectal cancer screening usually begins at age 35 and continues through age 50.  If you have risk factors for colon cancer, your health care provider may recommend that you be screened at an earlier age.  If you have a family history of colorectal cancer, talk with your health care provider about genetic screening.  Your health care provider may also recommend using home test kits to check for hidden blood in your stool.  A small camera at the end of a  tube can be used to examine your colon directly (sigmoidoscopy or colonoscopy). This is done to check for the earliest forms of colorectal cancer.  Direct examination of the colon should be repeated every 5-10 years until age 22. However, if early forms of precancerous polyps or small growths are found or if you have a family history or genetic risk for colorectal cancer, you may need to be screened more often. Skin Cancer  Check your skin from head to toe regularly.  Monitor any moles. Be sure to tell your health care provider: ? About any new moles or changes in moles, especially if there is a change in a mole's shape or color. ? If you have a mole that is larger than the size of a pencil eraser.  If any of your family members has a history of skin cancer, especially at a young age, talk with your health care provider about genetic screening.  Always use sunscreen. Apply sunscreen liberally and repeatedly throughout the day.  Whenever you are outside, protect yourself by wearing long sleeves, pants, a wide-brimmed hat, and sunglasses. What should I know about osteoporosis? Osteoporosis is a condition in which bone destruction happens more quickly than new bone creation. After menopause, you may be at an increased risk for osteoporosis. To help prevent osteoporosis or the bone fractures that can happen because of osteoporosis, the following is recommended:  If you are 109-50 years old, get at least 1,000 mg of calcium and at least 600 mg of vitamin D per day.  If you are older than age 80 but younger than age 15, get at least 1,200 mg of calcium and at least 600 mg of vitamin D per day.  If you are older than age 45, get at least 1,200 mg of calcium and at least 800 mg of vitamin D per day. Smoking and excessive alcohol intake increase the risk of osteoporosis. Eat foods that are rich in calcium and vitamin D, and do weight-bearing exercises several times each week as directed by your health  care provider. What should I know about how menopause affects my mental health? Depression may occur at any age, but it is more common as you become older. Common symptoms of depression include:  Low or sad mood.  Changes in sleep patterns.  Changes in appetite or eating patterns.  Feeling an overall lack of motivation or enjoyment of activities that you previously enjoyed.  Frequent crying spells. Talk with your health care provider if you think that you are experiencing depression. What should I know about immunizations? It is important that you get and maintain your immunizations. These include:  Tetanus, diphtheria, and pertussis (Tdap) booster vaccine.  Influenza every year before the flu season begins.  Pneumonia vaccine.  Shingles vaccine. Your health care provider may also recommend other immunizations. This  information is not intended to replace advice given to you by your health care provider. Make sure you discuss any questions you have with your health care provider. Document Released: 09/17/2005 Document Revised: 02/13/2016 Document Reviewed: 04/29/2015 Elsevier Interactive Patient Education  2019 [employer].

## 2019-01-09 NOTE — Progress Notes (Signed)
Patient ID: Joanne Taylor, female    DOB: 09/23/1950  Age: 68 y.o. MRN: [id number]  The patient is here for annual preventive  examination and management of other chronic and acute problems.   Annual mammogram March 06 2018 Colonoscopy 2017 by Joanne Taylor, no polyps  DEXA normal 2016  Repeat 2021  Labs July 2019:   The risk factors are reflected in the social history.  The roster of all physicians providing medical care to patient - is listed in the Snapshot section of the chart.  Activities of daily living:  The patient is 100% independent in all ADLs: dressing, toileting, feeding as well as independent mobility  Home safety : The patient has smoke detectors in the home. They wear seatbelts.  There are no firearms at home. There is no violence in the home.   There is no risks for hepatitis, STDs or HIV. There is no   history of blood transfusion. They have no travel history to infectious disease endemic areas of the world.  The patient has seen their dentist in the last six month. They have seen their eye doctor in the last year. They admit to slight hearing difficulty with regard to whispered voices and some television programs.  They have deferred audiologic testing in the last year.  They do not  have excessive sun exposure. Discussed the need for sun protection: hats, long sleeves and use of sunscreen if there is significant sun exposure.   Diet: the importance of a healthy diet is discussed. They do have a healthy diet.  The benefits of regular aerobic exercise were discussed. She walks 4 times per week ,  20 minutes.   Depression screen: there are no signs or vegative symptoms of depression- irritability, change in appetite, anhedonia, sadness/tearfullness.  Cognitive assessment: the patient manages all their financial and personal affairs and is actively engaged. They could relate day,date,year and events; recalled 2/3 objects at 3 minutes; performed clock-face test normally.  The  following portions of the patient's history were reviewed and updated as appropriate: allergies, current medications, past family history, past medical history,  past surgical history, past social history  and problem list.  Visual acuity was not assessed per patient preference since she has regular follow up with her ophthalmologist. Hearing and body mass index were assessed and reviewed.   During the course of the visit the patient was educated and counseled about appropriate screening and preventive services including : fall prevention , diabetes screening, nutrition counseling, colorectal cancer screening, and recommended immunizations.    CC: The primary encounter diagnosis was Acquired hypothyroidism. Diagnoses of Obesity (BMI 30.0-34.9), Overweight (BMI 25.0-29.9), and Visit for preventive health examination were also pertinent to this visit.  Has recovered from grief of the  loss of mother  September 23  Whose last event occurred the day patient was leaving for a [place] vacation . She lasted 17 days in Hospice .  Trip has been postponed.   Seeing [facility]/PT for right shoulder impingement at [facility]    Had a reaction to steroid injection with elevated bpp lasted one day  The patient has no signs or symptoms of COVID 19 infection (fever, cough, sore throat  or shortness of breath beyond what is typical for patient).  Patient denies contact with other persons with the above mentioned symptoms or with anyone confirmed to have COVID 19   Therapy for strained MCL right knee received PT no MRI done but symptoms improving  And able to walk  for exercise   Basal cell ca right arm surgery is planned July 16 Joanne Taylor   Had a frenectomy done for receding gums done by Oral surgery    History Joanne Taylor has a past medical history of Osteopenia.   She has a past surgical history that includes Tubal ligation; Colonoscopy; and Colonoscopy with propofol (N/A, 01/09/2016).   Her family history includes  Cancer in her father; Congestive Heart Failure in her mother; Hyperlipidemia in her father; Hypertension in her mother; Mental illness in her father and mother.She reports that she quit smoking about 46 years ago. Her smoking use included cigarettes. She has never used smokeless tobacco. She reports current alcohol use of about 5.0 standard drinks of alcohol per week. She reports that she does not use drugs.  Outpatient Medications Prior to Visit  Medication Sig Dispense Refill  . Calcium Carb-Cholecalciferol (CALCIUM 1000 + D) 1000-800 MG-UNIT TABS Take by mouth.    . estradiol (ESTRACE) 0.5 MG tablet TAKE ONE TABLET BY MOUTH DAILY 30 tablet 1  . fluticasone (FLONASE) 50 MCG/ACT nasal spray Place into the nose.    . levothyroxine (SYNTHROID) 50 MCG tablet TAKE ONE TABLET BY MOUTH DAILY 90 tablet 0  . Multiple Vitamin (MULTIVITAMIN) tablet Take 1 tablet by mouth daily.    . Probiotic Product (PROBIOTIC FORMULA PO) Take by mouth.    . progesterone (PROMETRIUM) 100 MG capsule TAKE ONE CAPSULE BY MOUTH DAILY 30 capsule 1   No facility-administered medications prior to visit.     Review of Systems   Patient denies headache, fevers, malaise, unintentional weight loss, skin rash, eye pain, sinus congestion and sinus pain, sore throat, dysphagia,  hemoptysis , cough, dyspnea, wheezing, chest pain, palpitations, orthopnea, edema, abdominal pain, nausea, melena, diarrhea, constipation, flank pain, dysuria, hematuria, urinary  Frequency, nocturia, numbness, tingling, seizures,  Focal weakness, Loss of consciousness,  Tremor, insomnia, depression, anxiety, and suicidal ideation.     Objective:  BP 130/86 (BP Location: Left Arm, Patient Position: Sitting, Cuff Size: Normal)   Pulse 91   Temp 98.1 F (36.7 C) (Oral)   Resp 14   Ht 5' 7.5" (1.715 m)   Wt 198 lb 9.6 oz (90.1 kg)   SpO2 99%   BMI 30.65 kg/m   Physical Exam  General appearance: alert, cooperative and appears stated age Ears: normal  TM's and external ear canals both ears Throat: lips, mucosa, and tongue normal; teeth and gums normal Neck: no adenopathy, no carotid bruit, supple, symmetrical, trachea midline and thyroid not enlarged, symmetric, no tenderness/mass/nodules Back: symmetric, no curvature. ROM normal. No CVA tenderness. Lungs: clear to auscultation bilaterally Heart: regular rate and rhythm, S1, S2 normal, no murmur, click, rub or gallop Abdomen: soft, non-tender; bowel sounds normal; no masses,  no organomegaly Pulses: 2+ and symmetric Skin: Skin color, texture, turgor normal. No rashes or lesions Lymph nodes: Cervical, supraclavicular, and axillary nodes normal.  Assessment & Plan:   Problem List Items Addressed This Visit    Visit for preventive health examination    age appropriate education and counseling updated, referrals for preventative services and immunizations addressed, dietary and smoking counseling addressed, most recent labs reviewed.  I have personally reviewed and have noted:  1) the patient's medical and social history 2) The pt's use of alcohol, tobacco, and illicit drugs 3) The patient's current medications and supplements 4) Functional ability including ADL's, fall risk, home safety risk, hearing and visual impairment 5) Diet and physical activities 6) Evidence for depression or mood  disorder 7) The patient's height, weight, and BMI have been recorded in the chart  I have made referrals, and provided counseling and education based on review of the above      Overweight (BMI 25.0-29.9)    Other Visit Diagnoses    Acquired hypothyroidism    -  Primary   Relevant Orders   TSH   Obesity (BMI 30.0-34.9)       Relevant Orders   Lipid panel   Comprehensive metabolic panel      I am having Joanne Taylor "Joanne Taylor" maintain her multivitamin, Calcium Carb-Cholecalciferol, Probiotic Product (PROBIOTIC FORMULA PO), fluticasone, estradiol, progesterone, and levothyroxine.  No  orders of the defined types were placed in this encounter.   There are no discontinued medications.  Follow-up: No follow-ups on file.   Crecencio Mc, MD

## 2019-01-10 NOTE — Assessment & Plan Note (Signed)

## 2019-01-12 ENCOUNTER — Other Ambulatory Visit: Payer: Self-pay

## 2019-01-12 ENCOUNTER — Other Ambulatory Visit (INDEPENDENT_AMBULATORY_CARE_PROVIDER_SITE_OTHER): Payer: Medicare Other

## 2019-01-12 DIAGNOSIS — E669 Obesity, unspecified: Secondary | ICD-10-CM | POA: Diagnosis not present

## 2019-01-12 DIAGNOSIS — E039 Hypothyroidism, unspecified: Secondary | ICD-10-CM

## 2019-01-12 LAB — LIPID PANEL
Cholesterol: 176 mg/dL (ref 0–200)
HDL: 76.8 mg/dL (ref >39.00)
LDL Cholesterol: 82 mg/dL (ref 0–99)
NonHDL: 98.76
Total CHOL/HDL Ratio: 2
Triglycerides: 83 mg/dL (ref 0.0–149.0)
VLDL: 16.6 mg/dL (ref 0.0–40.0)

## 2019-01-12 LAB — COMPREHENSIVE METABOLIC PANEL
ALT: 14 U/L (ref 0–35)
AST: 17 U/L (ref 0–37)
Albumin: 4.4 g/dL (ref 3.5–5.2)
Alkaline Phosphatase: 78 U/L (ref 39–117)
BUN: 16 mg/dL (ref 6–23)
CO2: 26 mEq/L (ref 19–32)
Calcium: 8.9 mg/dL (ref 8.4–10.5)
Chloride: 103 mEq/L (ref 96–112)
Creatinine, Ser: 0.93 mg/dL (ref 0.40–1.20)
GFR: 59.8 mL/min — ABNORMAL LOW (ref >60.00)
Glucose, Bld: 88 mg/dL (ref 70–99)
Potassium: 4.1 mEq/L (ref 3.5–5.1)
Sodium: 138 mEq/L (ref 135–145)
Total Bilirubin: 0.7 mg/dL (ref 0.2–1.2)
Total Protein: 6.7 g/dL (ref 6.0–8.3)

## 2019-01-12 LAB — TSH: TSH: 4.11 u[IU]/mL (ref 0.35–4.50)

## 2019-02-07 ENCOUNTER — Other Ambulatory Visit: Payer: Self-pay | Admitting: Internal Medicine

## 2019-02-12 ENCOUNTER — Other Ambulatory Visit: Payer: Self-pay | Admitting: Internal Medicine

## 2019-02-12 DIAGNOSIS — Z1231 Encounter for screening mammogram for malignant neoplasm of breast: Secondary | ICD-10-CM

## 2019-03-25 ENCOUNTER — Other Ambulatory Visit: Payer: Self-pay | Admitting: Internal Medicine

## 2019-03-26 ENCOUNTER — Ambulatory Visit
Admission: RE | Admit: 2019-03-26 | Discharge: 2019-03-26 | Disposition: A | Discharge status: Home / Self Care | Payer: Medicare Other | Source: Ambulatory Visit | Attending: Internal Medicine | Admitting: Internal Medicine

## 2019-03-26 ENCOUNTER — Other Ambulatory Visit: Payer: Self-pay | Admitting: Internal Medicine

## 2019-03-26 DIAGNOSIS — R928 Other abnormal and inconclusive findings on diagnostic imaging of breast: Secondary | ICD-10-CM

## 2019-03-26 DIAGNOSIS — Z1231 Encounter for screening mammogram for malignant neoplasm of breast: Secondary | ICD-10-CM | POA: Insufficient documentation

## 2019-03-26 DIAGNOSIS — N6310 Unspecified lump in the right breast, unspecified quadrant: Secondary | ICD-10-CM

## 2019-04-09 ENCOUNTER — Ambulatory Visit
Admission: RE | Admit: 2019-04-09 | Discharge: 2019-04-09 | Disposition: A | Discharge status: Home / Self Care | Payer: Medicare Other | Source: Ambulatory Visit | Attending: Internal Medicine | Admitting: Internal Medicine

## 2019-04-09 DIAGNOSIS — N6310 Unspecified lump in the right breast, unspecified quadrant: Secondary | ICD-10-CM

## 2019-04-09 DIAGNOSIS — R928 Other abnormal and inconclusive findings on diagnostic imaging of breast: Secondary | ICD-10-CM

## 2019-06-25 ENCOUNTER — Other Ambulatory Visit: Payer: Self-pay | Admitting: Internal Medicine

## 2019-07-30 ENCOUNTER — Telehealth: Payer: Self-pay | Admitting: Internal Medicine

## 2019-07-30 NOTE — Telephone Encounter (Signed)
°  Patient thinks she may be getting a ear infection. No available appts.

## 2019-07-30 NOTE — Telephone Encounter (Signed)
Spoke with pt and she stated that she was able to get an appt and has been seen already.

## 2019-08-16 ENCOUNTER — Other Ambulatory Visit: Payer: Self-pay | Admitting: Internal Medicine

## 2019-09-13 ENCOUNTER — Other Ambulatory Visit: Payer: Self-pay | Admitting: Internal Medicine

## 2019-09-18 ENCOUNTER — Other Ambulatory Visit: Payer: Self-pay | Admitting: Internal Medicine

## 2019-10-10 ENCOUNTER — Other Ambulatory Visit: Payer: Self-pay

## 2019-10-10 ENCOUNTER — Ambulatory Visit (INDEPENDENT_AMBULATORY_CARE_PROVIDER_SITE_OTHER): Payer: Medicare PPO

## 2019-10-10 VITALS — Ht 67.5 in | Wt 183.0 lb

## 2019-10-10 DIAGNOSIS — R7989 Other specified abnormal findings of blood chemistry: Secondary | ICD-10-CM | POA: Diagnosis not present

## 2019-10-10 DIAGNOSIS — Z0000 Encounter for general adult medical examination without abnormal findings: Secondary | ICD-10-CM

## 2019-10-10 NOTE — Patient Instructions (Addendum)
  Joanne Taylor , Thank you for taking time to come for your Medicare Wellness Visit. I appreciate your ongoing commitment to your health goals. Please review the following plan we discussed and let me know if I can assist you in the future.   These are the goals we discussed: Goals      Patient Stated   . I want to lose another 10-15lbs (pt-stated)     Healthy diet Stay active       This is a list of the screening recommended for you and due dates:  Health Maintenance  Topic Date Due  . Mammogram  03/25/2020  . Tetanus Vaccine  02/08/2024  . Colon Cancer Screening  01/08/2026  . Flu Shot  Completed  . DEXA scan (bone density measurement)  Completed  .  Hepatitis C: One time screening is recommended by Center for Disease Control  (CDC) for  adults born from [birth year] through 1966.   Completed  . Pneumonia vaccines  Completed

## 2019-10-10 NOTE — Progress Notes (Addendum)
Subjective:   Joanne Taylor is a 69 y.o. female who presents for Medicare Annual (Subsequent) preventive examination.  Review of Systems:  No ROS.  Medicare Wellness Virtual Visit.  Visual/audio telehealth visit, UTA vital signs.  Ht/Wt provided.  See social history for additional risk factors.   Cardiac Risk Factors include: advanced age (>64men, >47 women)     Objective:     Vitals: Ht 5' 7.5" (1.715 m)   Wt 183 lb (83 kg)   BMI 28.24 kg/m   Body mass index is 28.24 kg/m.  Advanced Directives 10/10/2019 10/09/2018 07/18/2018 09/16/2017  Does Patient Have a Medical Advance Directive? Yes Yes No No  Type of Paramedic of [place];Living will [place];Living will - -  Does patient want to make changes to medical advance directive? No - Patient declined No - Patient declined - -  Copy of [place] in Chart? No - copy requested No - copy requested - -  Would patient like information on creating a medical advance directive? - - - No - Patient declined    Tobacco Social History   Tobacco Use  Smoking Status Former Smoker   Types: Cigarettes   Quit date: 02/07/1972   Years since quitting: 47.7  Smokeless Tobacco Never Used     Counseling given: Not Answered   Clinical Intake:  Pre-visit preparation completed: Yes        Diabetes: No  How often do you need to have someone help you when you read instructions, pamphlets, or other written materials from your doctor or pharmacy?: 1 - Never  Interpreter Needed?: No     Past Medical History:  Diagnosis Date   Osteopenia    Past Surgical History:  Procedure Laterality Date   COLONOSCOPY     COLONOSCOPY WITH PROPOFOL N/A 01/09/2016   Procedure: COLONOSCOPY WITH PROPOFOL;  Surgeon: Lollie Sails, MD;  Location: [facility] ENDOSCOPY;  Service: Endoscopy;  Laterality: N/A;   TUBAL LIGATION     Family History  Problem Relation Age of Onset   Hypertension  Mother    Mental illness Mother        depressive disorder   Congestive Heart Failure Mother    Cancer Father        prostate   Mental illness Father        lewy body Dementia   Hyperlipidemia Father    Breast cancer Neg Hx    Social History   Socioeconomic History   Marital status: Married    Spouse name: Not on file   Number of children: Not on file   Years of education: Not on file   Highest education level: Not on file  Occupational History   Occupation: Asst Garment/textile technologist: acc    Comment: at [place] Use   Smoking status: Former Smoker    Types: Cigarettes    Quit date: 02/07/1972    Years since quitting: 47.7   Smokeless tobacco: Never Used  Substance and Sexual Activity   Alcohol use: Yes    Alcohol/week: 5.0 standard drinks    Types: 5 Standard drinks or equivalent per week   Drug use: No   Sexual activity: Not on file  Other Topics Concern   Not on file  Social History Narrative   Not on file   Social Determinants of Health   Financial Resource Strain:    Difficulty of Paying Living Expenses: Not on file  Food Insecurity:    Worried About Charity fundraiser in the Last Year: Not on file   [employer] of Food in the Last Year: Not on file  Transportation Needs:    Lack of Transportation (Medical): Not on file   Lack of Transportation (Non-Medical): Not on file  Physical Activity:    Days of Exercise per Week: Not on file   Minutes of Exercise per Session: Not on file  Stress:    Feeling of Stress : Not on file  Social Connections:    Frequency of Communication with Friends and Family: Not on file   Frequency of Social Gatherings with Friends and Family: Not on file   Attends Religious Services: Not on file   Active Member of Clubs or Organizations: Not on file   Attends Club or Organization Meetings: Not on file   Marital Status: Not on file    Outpatient Encounter Medications as of 10/10/2019  Medication Sig   Calcium Carb-Cholecalciferol  (CALCIUM 1000 + D) 1000-800 MG-UNIT TABS Take by mouth.   estradiol (ESTRACE) 0.5 MG tablet TAKE ONE TABLET BY MOUTH DAILY   fluticasone (FLONASE) 50 MCG/ACT nasal spray Place into the nose.   levothyroxine (SYNTHROID) 50 MCG tablet TAKE ONE TABLET BY MOUTH DAILY   Multiple Vitamin (MULTIVITAMIN) tablet Take 1 tablet by mouth daily.   Probiotic Product (PROBIOTIC FORMULA PO) Take by mouth.   progesterone (PROMETRIUM) 100 MG capsule TAKE ONE CAPSULE BY MOUTH DAILY   No facility-administered encounter medications on file as of 10/10/2019.    Activities of Daily Living In your present state of health, do you have any difficulty performing the following activities: 10/10/2019  Hearing? N  Vision? N  Difficulty concentrating or making decisions? N  Walking or climbing stairs? N  Dressing or bathing? N  Doing errands, shopping? N  Preparing Food and eating ? N  Using the Toilet? N  In the past six months, have you accidently leaked urine? N  Do you have problems with loss of bowel control? N  Managing your Medications? N  Managing your Finances? N  Housekeeping or managing your Housekeeping? N  Some recent data might be hidden    Patient Care Team: Crecencio Mc, MD as PCP - General (Internal Medicine)    Assessment:   This is a routine wellness examination for Joanne Taylor.  Nurse connected with patient 10/10/19 at 10:30 AM EST by a telephone enabled telemedicine application and verified that I am speaking with the correct person using two identifiers. Patient stated full name and DOB. Patient gave permission to continue with virtual visit. Patient's location was at home and Nurse's location was at [place] office.   Patient is alert and oriented x3. Patient denies difficulty focusing or concentrating. 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   Health Maintenance Due: See completed HM at the end of note.    Eye: Visual acuity not assessed. Virtual visit. Followed by their ophthalmologist.  Dental: Visits every 6 months.    Hearing: Demonstrates normal hearing during visit.  Safety:  Patient feels safe at home- yes Patient does have smoke detectors at home- yes Patient does wear sunscreen or protective clothing when in direct sunlight - yes Patient does wear seat belt when in a moving vehicle - yes Patient drives- yes Adequate lighting in walkways free from debris- yes Grab bars and handrails used as appropriate- yes Ambulates with an assistive  device- no  Social: Alcohol intake - yes      Smoking history- former Smokers in home? none Illicit drug use? none  Medication: Taking as directed and without issues.  Self managed - yes   Covid-19: Precautions and sickness symptoms discussed. Wears mask, social distancing, hand hygiene as appropriate.   Activities of Daily Living Patient denies needing assistance with: household chores, feeding themselves, getting from bed to chair, getting to the toilet, bathing/showering, dressing, managing money, or preparing meals.   Discussed the importance of a healthy diet, water intake and the benefits of aerobic exercise.   Physical activity- walking 1.5 daily  Diet:  Reduced sugar/low carb Water: good intake Caffeine: 2 cups of coffee  Other Providers Patient Care Team: Crecencio Mc, MD as PCP - General (Internal Medicine)  Exercise Activities and Dietary recommendations Current Exercise Habits: Home exercise routine, Type of exercise: walking, Time (Minutes): 30, Frequency (Times/Week): 7, Weekly Exercise (Minutes/Week): 210, Intensity: Moderate  Goals       Patient Stated    I want to lose another 10-15lbs (pt-stated)     Healthy diet Stay active        Fall Risk Fall Risk  10/10/2019 10/09/2018 09/16/2017 11/16/2016 11/10/2015  Falls in the past year? 0 1 No Yes No  Number falls in past yr: - - - 1 -  Injury with Fall?  - - - No -  Follow up Falls evaluation completed - - - -   Timed Get Up and Go performed: no, virtual visit  Depression Screen PHQ 2/9 Scores 10/10/2019 10/09/2018 09/16/2017 11/16/2016  PHQ - 2 Score 0 0 0 0     Cognitive Function MMSE - Mini Mental State Exam 10/09/2018 09/16/2017  Orientation to time 5 5  Orientation to Place 5 5  Registration 3 3  Attention/ Calculation 5 5  Recall 3 3  Language- name 2 objects 2 2  Language- repeat 1 1  Language- follow 3 step command 3 3  Language- read & follow direction 1 1  Write a sentence 1 1  Copy design 1 1  Total score 30 30     6CIT Screen 10/10/2019  What Year? 0 points  What month? 0 points  What time? 0 points  Count back from 20 0 points  Months in reverse 0 points  Repeat phrase 0 points  Total Score 0    Immunization History  Administered Date(s) Administered   Influenza Split 05/23/2012   Influenza, High Dose Seasonal PF 05/24/2018   Influenza-Unspecified 06/24/2013, 05/20/2015, 06/06/2017, 05/16/2019   PFIZER SARS-COV-2 Vaccination 09/18/2019, 10/09/2019   Pneumococcal Conjugate-13 05/28/2015   Pneumococcal Polysaccharide-23 11/16/2016   Tdap 02/07/2014   Zoster 11/01/2013   Screening Tests Health Maintenance  Topic Date Due   MAMMOGRAM  03/25/2020   TETANUS/TDAP  02/08/2024   COLONOSCOPY  01/08/2026   INFLUENZA VACCINE  Completed   DEXA SCAN  Completed   Hepatitis C Screening  Completed   PNA vac Low Risk Adult  Completed       Plan:   Keep all routine maintenance appointments.   Next scheduled lab 10/30/19 @ 2:00. Requests TSH.  Follow up 11/07/19 @ 9:30  Medicare Attestation I have personally reviewed: The patient's medical and social history Their use of alcohol, tobacco or illicit drugs Their current medications and supplements The patient's functional ability including ADLs,fall risks, home safety risks, cognitive, and hearing and visual impairment Diet and physical activities Evidence for  depression   I  have reviewed and discussed with patient certain preventive protocols, quality metrics, and best practice recommendations.     OBrien-Blaney, Deseray Daponte L, LPN  [id number]   I have reviewed the above information and agree with above.   Deborra Medina, MD

## 2019-10-30 ENCOUNTER — Other Ambulatory Visit (INDEPENDENT_AMBULATORY_CARE_PROVIDER_SITE_OTHER): Payer: Medicare PPO

## 2019-10-30 ENCOUNTER — Other Ambulatory Visit: Payer: Self-pay

## 2019-10-30 DIAGNOSIS — R7989 Other specified abnormal findings of blood chemistry: Secondary | ICD-10-CM

## 2019-10-30 LAB — TSH: TSH: 2.97 u[IU]/mL (ref 0.35–4.50)

## 2019-11-07 ENCOUNTER — Other Ambulatory Visit: Payer: Self-pay

## 2019-11-07 ENCOUNTER — Encounter: Payer: Self-pay | Admitting: Internal Medicine

## 2019-11-07 ENCOUNTER — Ambulatory Visit: Payer: Medicare PPO | Admitting: Internal Medicine

## 2019-11-07 VITALS — BP 116/76 | HR 68 | Temp 97.1°F | Resp 15 | Ht 67.5 in | Wt 185.6 lb

## 2019-11-07 DIAGNOSIS — R944 Abnormal results of kidney function studies: Secondary | ICD-10-CM | POA: Diagnosis not present

## 2019-11-07 DIAGNOSIS — E039 Hypothyroidism, unspecified: Secondary | ICD-10-CM

## 2019-11-07 DIAGNOSIS — H6982 Other specified disorders of Eustachian tube, left ear: Secondary | ICD-10-CM

## 2019-11-07 DIAGNOSIS — J3089 Other allergic rhinitis: Secondary | ICD-10-CM

## 2019-11-07 DIAGNOSIS — H6992 Unspecified Eustachian tube disorder, left ear: Secondary | ICD-10-CM

## 2019-11-07 DIAGNOSIS — C44612 Basal cell carcinoma of skin of right upper limb, including shoulder: Secondary | ICD-10-CM

## 2019-11-07 DIAGNOSIS — M8580 Other specified disorders of bone density and structure, unspecified site: Secondary | ICD-10-CM

## 2019-11-07 DIAGNOSIS — Z0000 Encounter for general adult medical examination without abnormal findings: Secondary | ICD-10-CM

## 2019-11-07 DIAGNOSIS — E663 Overweight: Secondary | ICD-10-CM | POA: Diagnosis not present

## 2019-11-07 HISTORY — DX: Basal cell carcinoma of skin of right upper limb, including shoulder: C44.612

## 2019-11-07 LAB — COMPREHENSIVE METABOLIC PANEL
ALT: 14 U/L (ref 0–35)
AST: 19 U/L (ref 0–37)
Albumin: 4.4 g/dL (ref 3.5–5.2)
Alkaline Phosphatase: 87 U/L (ref 39–117)
BUN: 13 mg/dL (ref 6–23)
CO2: 27 mEq/L (ref 19–32)
Calcium: 9.2 mg/dL (ref 8.4–10.5)
Chloride: 101 mEq/L (ref 96–112)
Creatinine, Ser: 0.89 mg/dL (ref 0.40–1.20)
GFR: 62.76 mL/min (ref >60.00)
Glucose, Bld: 99 mg/dL (ref 70–99)
Potassium: 4 mEq/L (ref 3.5–5.1)
Sodium: 135 mEq/L (ref 135–145)
Total Bilirubin: 0.7 mg/dL (ref 0.2–1.2)
Total Protein: 7.1 g/dL (ref 6.0–8.3)

## 2019-11-07 LAB — CBC WITH DIFFERENTIAL/PLATELET
Basophils Absolute: 0 10*3/uL (ref 0.0–0.1)
Basophils Relative: 0.7 % (ref 0.0–3.0)
Eosinophils Absolute: 0.1 10*3/uL (ref 0.0–0.7)
Eosinophils Relative: 1.2 % (ref 0.0–5.0)
HCT: 42.4 % (ref 36.0–46.0)
Hemoglobin: 14.1 g/dL (ref 12.0–15.0)
Lymphocytes Relative: 31.3 % (ref 12.0–46.0)
Lymphs Abs: 2 10*3/uL (ref 0.7–4.0)
MCHC: 33.4 g/dL (ref 30.0–36.0)
MCV: 91.2 fl (ref 78.0–100.0)
Monocytes Absolute: 0.5 10*3/uL (ref 0.1–1.0)
Monocytes Relative: 8.2 % (ref 3.0–12.0)
Neutro Abs: 3.7 10*3/uL (ref 1.4–7.7)
Neutrophils Relative %: 58.6 % (ref 43.0–77.0)
Platelets: 255 10*3/uL (ref 150.0–400.0)
RBC: 4.65 Mil/uL (ref 3.87–5.11)
RDW: 14.3 % (ref 11.5–15.5)
WBC: 6.3 10*3/uL (ref 4.0–10.5)

## 2019-11-07 MED ORDER — FLUTICASONE PROPIONATE 50 MCG/ACT NA SUSP: 2.0000 | Freq: Every day | NASAL | 6 refills | Status: DC

## 2019-11-07 MED ORDER — PREDNISONE 10 MG PO TABS: ORAL_TABLET | ORAL | 0 refills | Status: DC

## 2019-11-07 NOTE — Patient Instructions (Addendum)
Have Dasher look at the two spots on your left chest wall    For the Eustachian tube/ear problem:  Prednisone taper:  Take 6 tablets all at once on Day 1,  Then taper by 1 tablet daily until gone  Start using the Flonase daily TODAY and continue using it for 6 months    Health Maintenance After Age 70 After age 31, you are at a higher risk for certain long-term diseases and infections as well as injuries from falls. Falls are a major cause of broken bones and head injuries in people who are older than age 50. Getting regular preventive care can help to keep you healthy and well. Preventive care includes getting regular testing and making lifestyle changes as recommended by your health care provider. Talk with your health care provider about:  Which screenings and tests you should have. A screening is a test that checks for a disease when you have no symptoms.  A diet and exercise plan that is right for you. What should I know about screenings and tests to prevent falls? Screening and testing are the best ways to find a health problem early. Early diagnosis and treatment give you the best chance of managing medical conditions that are common after age 78. Certain conditions and lifestyle choices may make you more likely to have a fall. Your health care provider may recommend:  Regular vision checks. Poor vision and conditions such as cataracts can make you more likely to have a fall. If you wear glasses, make sure to get your prescription updated if your vision changes.  Medicine review. Work with your health care provider to regularly review all of the medicines you are taking, including over-the-counter medicines. Ask your health care provider about any side effects that may make you more likely to have a fall. Tell your health care provider if any medicines that you take make you feel dizzy or sleepy.  Osteoporosis screening. Osteoporosis is a condition that causes the bones to get weaker.  This can make the bones weak and cause them to break more easily.  Blood pressure screening. Blood pressure changes and medicines to control blood pressure can make you feel dizzy.  Strength and balance checks. Your health care provider may recommend certain tests to check your strength and balance while standing, walking, or changing positions.  Foot health exam. Foot pain and numbness, as well as not wearing proper footwear, can make you more likely to have a fall.  Depression screening. You may be more likely to have a fall if you have a fear of falling, feel emotionally low, or feel unable to do activities that you used to do.  Alcohol use screening. Using too much alcohol can affect your balance and may make you more likely to have a fall. What actions can I take to lower my risk of falls? General instructions  Talk with your health care provider about your risks for falling. Tell your health care provider if: ? You fall. Be sure to tell your health care provider about all falls, even ones that seem minor. ? You feel dizzy, sleepy, or off-balance.  Take over-the-counter and prescription medicines only as told by your health care provider. These include any supplements.  Eat a healthy diet and maintain a healthy weight. A healthy diet includes low-fat dairy products, low-fat (lean) meats, and fiber from whole grains, beans, and lots of fruits and vegetables. Home safety  Remove any tripping hazards, such as rugs, cords, and clutter.  Install safety equipment such as grab bars in bathrooms and safety rails on stairs.  Keep rooms and walkways well-lit. Activity   Follow a regular exercise program to stay fit. This will help you maintain your balance. Ask your health care provider what types of exercise are appropriate for you.  If you need a cane or walker, use it as recommended by your health care provider.  Wear supportive shoes that have nonskid soles. Lifestyle  Do not  drink alcohol if your health care provider tells you not to drink.  If you drink alcohol, limit how much you have: ? 0-1 drink a day for women. ? 0-2 drinks a day for men.  Be aware of how much alcohol is in your drink. In the [place] one drink equals one typical bottle of beer (12 oz), one-half glass of wine (5 oz), or one shot of hard liquor (1 oz).  Do not use any products that contain nicotine or tobacco, such as cigarettes and e-cigarettes. If you need help quitting, ask your health care provider. Summary  Having a healthy lifestyle and getting preventive care can help to protect your health and wellness after age 8.  Screening and testing are the best way to find a health problem early and help you avoid having a fall. Early diagnosis and treatment give you the best chance for managing medical conditions that are more common for people who are older than age 90.  Falls are a major cause of broken bones and head injuries in people who are older than age 49. Take precautions to prevent a fall at home.  Work with your health care provider to learn what changes you can make to improve your health and wellness and to prevent falls. This information is not intended to replace advice given to you by your health care provider. Make sure you discuss any questions you have with your health care provider. Document Revised: 11/16/2018 Document Reviewed: 06/08/2017 Elsevier Patient Education  2020 [employer].

## 2019-11-07 NOTE — Progress Notes (Signed)
Subjective:  Patient ID: Joanne Taylor, female    DOB: [birth year]  Age: 69 y.o. MRN: [id number]  CC: The primary encounter diagnosis was Visit for preventive health examination. Diagnoses of Basal cell carcinoma (BCC) of right upper arm, Allergic rhinitis due to American house dust mite, Decreased calculated GFR, Acquired hypothyroidism, Overweight (BMI 25.0-29.9), Osteopenia, unspecified location, and Dysfunction of left eustachian tube were also pertinent to this visit.  HPI Joanne Taylor presents for follow up on  hypothyroidism managed with levothyroxine   Recurrent left ear otitis treated by neighbor Talmadge Chad .  Fluid behind ear.  Still has feeling of fullness,  .  History of ENT surgery by [facility] for deviated septum remotedly.  No longer using Flonase.  Was not treated with prednisone . No facial pain or purulent rhinorrhea.  No fevers   Basal cell CA removed from right upper arm Jun 2020 by Dasher.   Gained weight last year during mother's decline and eventual death.  Has lost 21 lbs using [employer]   Has started a walkig program  Outpatient Medications Prior to Visit  Medication Sig Dispense Refill  . Calcium Carb-Cholecalciferol (CALCIUM 1000 + D) 1000-800 MG-UNIT TABS Take by mouth.    . estradiol (ESTRACE) 0.5 MG tablet TAKE ONE TABLET BY MOUTH DAILY 90 tablet 0  . levothyroxine (SYNTHROID) 50 MCG tablet TAKE ONE TABLET BY MOUTH DAILY 90 tablet 0  . Multiple Vitamin (MULTIVITAMIN) tablet Take 1 tablet by mouth daily.    . Probiotic Product (PROBIOTIC FORMULA PO) Take by mouth.    . progesterone (PROMETRIUM) 100 MG capsule TAKE ONE CAPSULE BY MOUTH DAILY 90 capsule 0  . fluticasone (FLONASE) 50 MCG/ACT nasal spray Place into the nose.     No facility-administered medications prior to visit.    Review of Systems;  Patient denies headache, fevers, malaise, unintentional weight loss, skin rash, eye pain, sinus congestion and sinus pain, sore throat,  dysphagia,  hemoptysis , cough, dyspnea, wheezing, chest pain, palpitations, orthopnea, edema, abdominal pain, nausea, melena, diarrhea, constipation, flank pain, dysuria, hematuria, urinary  Frequency, nocturia, numbness, tingling, seizures,  Focal weakness, Loss of consciousness,  Tremor, insomnia, depression, anxiety, and suicidal ideation.      Objective:  BP 116/76 (BP Location: Left Arm, Patient Position: Sitting, Cuff Size: Normal)   Pulse 68   Temp (!) 97.1 F (36.2 C) (Temporal)   Resp 15   Ht 5' 7.5" (1.715 m)   Wt 185 lb 9.6 oz (84.2 kg)   SpO2 98%   BMI 28.64 kg/m   BP Readings from Last 3 Encounters:  11/07/19 116/76  01/09/19 130/86  10/09/18 122/72    Wt Readings from Last 3 Encounters:  11/07/19 185 lb 9.6 oz (84.2 kg)  10/10/19 183 lb (83 kg)  01/09/19 198 lb 9.6 oz (90.1 kg)    General appearance: alert, cooperative and appears stated age Ears: normal TM's and external ear canals both ears Throat: lips, mucosa, and tongue normal; teeth and gums normal Neck: no adenopathy, no carotid bruit, supple, symmetrical, trachea midline and thyroid not enlarged, symmetric, no tenderness/mass/nodules Back: symmetric, no curvature. ROM normal. No CVA tenderness. Lungs: clear to auscultation bilaterally Heart: regular rate and rhythm, S1, S2 normal, no murmur, click, rub or gallop Abdomen: soft, non-tender; bowel sounds normal; no masses,  no organomegaly Pulses: 2+ and symmetric Skin: Skin color, texture, turgor normal. No rashes or lesions Lymph nodes: Cervical, supraclavicular, and axillary nodes normal.  No results found  for: HGBA1C  Lab Results  Component Value Date   CREATININE 0.89 11/07/2019   CREATININE 0.93 01/12/2019   CREATININE 0.89 11/17/2017    Lab Results  Component Value Date   WBC 6.3 11/07/2019   HGB 14.1 11/07/2019   HCT 42.4 11/07/2019   PLT 255.0 11/07/2019   GLUCOSE 99 11/07/2019   CHOL 176 01/12/2019   TRIG 83.0 01/12/2019   HDL  76.80 01/12/2019   LDLCALC 82 01/12/2019   ALT 14 11/07/2019   AST 19 11/07/2019   NA 135 11/07/2019   K 4.0 11/07/2019   CL 101 11/07/2019   CREATININE 0.89 11/07/2019   BUN 13 11/07/2019   CO2 27 11/07/2019   TSH 2.97 10/30/2019    MM DIAG BREAST TOMO UNI RIGHT  Result Date: 04/09/2019 CLINICAL DATA:  Right breast possible mass seen on most recent screening mammography. EXAM: DIGITAL DIAGNOSTIC UNILATERAL RIGHT MAMMOGRAM WITH CAD AND TOMO COMPARISON:  Previous exam(s). ACR Breast Density Category b: There are scattered areas of fibroglandular density. FINDINGS: Additional mammographic views of the right breast demonstrate no suspicious masses or areas of architectural distortion. The previously questioned mass in the right breast upper outer quadrant does not persist on additional views. Mammographic images were processed with CAD. IMPRESSION: No mammographic evidence of right breast malignancy. RECOMMENDATION: Screening mammogram in one year.(Code:SM-B-01Y) I have discussed the findings and recommendations with the patient. Results were also provided in writing at the conclusion of the visit. If applicable, a reminder letter will be sent to the patient regarding the next appointment. BI-RADS CATEGORY  1: Negative. Electronically Signed   By: Fidela Salisbury M.D.   On: 04/09/2019 10:45    Assessment & Plan:   Problem List Items Addressed This Visit      Unprioritized   Visit for preventive health examination - Primary   Overweight (BMI 25.0-29.9)    I have congratulated her in reduction of   BMI and encouraged  Continued maintenance of weight loss with portions size reduction and regular exercise.       Osteopenia    DEXA reviewed . Discussed the current controversies surrounding the risks and benefits of calcium supplementation.  Encouraged her to increase dietary calcium through natural foods including almond/coconut milk,  soymilk and vegetables.       Allergic rhinitis due  to American house dust mite   Relevant Orders   CBC with Differential/Platelet (Completed)   Acquired hypothyroidism    Thyroid function is WNL on current dose.  She has been able to lose excess weight more easily with normalization of thyroid function. No current changes needed.   Lab Results  Component Value Date   TSH 2.97 10/30/2019         Basal cell carcinoma (BCC) of right upper arm    Removed by Dasher June 2020.. she is wearing sunblock whenever she is outside.        Relevant Medications   predniSONE (DELTASONE) 10 MG tablet   Eustachian tube dysfunction    Appears to be secondary to allergic rhinitis.  Prednisone taper   X 6 days,  Resume flonase.        Other Visit Diagnoses    Decreased calculated GFR       Relevant Orders   Comprehensive metabolic panel (Completed)      I have discontinued Joanne Taylor "Cathy"'s fluticasone. I am also having her start on predniSONE and fluticasone. Additionally, I am having her maintain her multivitamin, Calcium Carb-Cholecalciferol, Probiotic  Product (PROBIOTIC FORMULA PO), progesterone, estradiol, and levothyroxine.  Meds ordered this encounter  Medications  . predniSONE (DELTASONE) 10 MG tablet    Sig: 6 tablets on Day 1 , then reduce by 1 tablet daily until gone    Dispense:  21 tablet    Refill:  0  . fluticasone (FLONASE) 50 MCG/ACT nasal spray    Sig: Place 2 sprays into both nostrils daily.    Dispense:  16 g    Refill:  6    Medications Discontinued During This Encounter  Medication Reason  . fluticasone (FLONASE) 50 MCG/ACT nasal spray Patient has not taken in last 30 days    I provided  30 minutes of face-to-face time during this encounter reviewing patient's current problems and past surgeries, labs and imaging studies, providing counseling on the above mentioned problems , and coordination  of care . Follow-up: No follow-ups on file.   Crecencio Mc, MD

## 2019-11-07 NOTE — Assessment & Plan Note (Addendum)
Removed by Dasher June 2020.. she is wearing sunblock whenever she is outside.

## 2019-11-09 DIAGNOSIS — H6990 Unspecified Eustachian tube disorder, unspecified ear: Secondary | ICD-10-CM | POA: Insufficient documentation

## 2019-11-09 DIAGNOSIS — H6980 Other specified disorders of Eustachian tube, unspecified ear: Secondary | ICD-10-CM | POA: Insufficient documentation

## 2019-11-09 NOTE — Assessment & Plan Note (Signed)
I have congratulated her in reduction of   BMI and encouraged  Continued maintenance of weight loss with portions size reduction and regular exercise.  ?

## 2019-11-09 NOTE — Assessment & Plan Note (Addendum)
Thyroid function is WNL on current dose.  She has been able to lose excess weight more easily with normalization of thyroid function. No current changes needed.   Lab Results  Component Value Date   TSH 2.97 10/30/2019

## 2019-11-09 NOTE — Assessment & Plan Note (Signed)
DEXA reviewed . Discussed the current controversies surrounding the risks and benefits of calcium supplementation.  Encouraged her to increase dietary calcium through natural foods including almond/coconut milk,  soymilk and vegetables.

## 2019-11-09 NOTE — Assessment & Plan Note (Signed)
Appears to be secondary to allergic rhinitis.  Prednisone taper   X 6 days,  Resume flonase.

## 2019-12-13 ENCOUNTER — Other Ambulatory Visit: Payer: Self-pay | Admitting: Internal Medicine

## 2019-12-23 ENCOUNTER — Other Ambulatory Visit: Payer: Self-pay | Admitting: Internal Medicine

## 2020-01-01 ENCOUNTER — Other Ambulatory Visit: Payer: Self-pay | Admitting: Internal Medicine

## 2020-01-28 DIAGNOSIS — D2271 Melanocytic nevi of right lower limb, including hip: Secondary | ICD-10-CM | POA: Diagnosis not present

## 2020-01-28 DIAGNOSIS — Z85828 Personal history of other malignant neoplasm of skin: Secondary | ICD-10-CM | POA: Diagnosis not present

## 2020-01-28 DIAGNOSIS — D225 Melanocytic nevi of trunk: Secondary | ICD-10-CM | POA: Diagnosis not present

## 2020-01-28 DIAGNOSIS — L570 Actinic keratosis: Secondary | ICD-10-CM | POA: Diagnosis not present

## 2020-01-28 DIAGNOSIS — D2262 Melanocytic nevi of left upper limb, including shoulder: Secondary | ICD-10-CM | POA: Diagnosis not present

## 2020-01-28 DIAGNOSIS — D2261 Melanocytic nevi of right upper limb, including shoulder: Secondary | ICD-10-CM | POA: Diagnosis not present

## 2020-02-13 ENCOUNTER — Other Ambulatory Visit: Payer: Self-pay | Admitting: Internal Medicine

## 2020-02-13 MED ORDER — ESTRADIOL 0.5 MG PO TABS: 0.5000 mg | ORAL_TABLET | Freq: Every day | ORAL | 0 refills | Status: DC

## 2020-02-13 MED ORDER — PROGESTERONE MICRONIZED 100 MG PO CAPS: 100.0000 mg | ORAL_CAPSULE | Freq: Every day | ORAL | 0 refills | Status: DC

## 2020-02-25 ENCOUNTER — Other Ambulatory Visit: Payer: Self-pay | Admitting: Internal Medicine

## 2020-02-25 DIAGNOSIS — Z1231 Encounter for screening mammogram for malignant neoplasm of breast: Secondary | ICD-10-CM

## 2020-03-19 DIAGNOSIS — H2513 Age-related nuclear cataract, bilateral: Secondary | ICD-10-CM | POA: Diagnosis not present

## 2020-03-23 ENCOUNTER — Other Ambulatory Visit: Payer: Self-pay | Admitting: Internal Medicine

## 2020-03-26 ENCOUNTER — Other Ambulatory Visit: Payer: Self-pay

## 2020-03-26 ENCOUNTER — Ambulatory Visit
Admission: RE | Admit: 2020-03-26 | Discharge: 2020-03-26 | Disposition: A | Discharge status: Home / Self Care | Payer: Medicare PPO | Source: Ambulatory Visit | Attending: Internal Medicine | Admitting: Internal Medicine

## 2020-03-26 DIAGNOSIS — Z1231 Encounter for screening mammogram for malignant neoplasm of breast: Secondary | ICD-10-CM | POA: Diagnosis not present

## 2020-03-31 DIAGNOSIS — R6889 Other general symptoms and signs: Secondary | ICD-10-CM | POA: Diagnosis not present

## 2020-05-26 ENCOUNTER — Ambulatory Visit: Payer: Medicare PPO | Attending: Internal Medicine

## 2020-05-26 DIAGNOSIS — Z23 Encounter for immunization: Secondary | ICD-10-CM

## 2020-05-26 NOTE — Progress Notes (Signed)
   Covid-19 Vaccination Clinic  Name:  Joanne Taylor    MRN: [id number] DOB: 03-20-1951  05/26/2020  Joanne Taylor was observed post Covid-19 immunization for 15 minutes without incident. She was provided with Vaccine Information Sheet and instruction to access the V-Safe system.   Joanne Taylor was instructed to call 911 with any severe reactions post vaccine: Marland Kitchen Difficulty breathing  . Swelling of face and throat  . A fast heartbeat  . A bad rash all over body  . Dizziness and weakness

## 2020-06-14 ENCOUNTER — Other Ambulatory Visit: Payer: Self-pay | Admitting: Internal Medicine

## 2020-08-12 ENCOUNTER — Other Ambulatory Visit: Payer: Medicare PPO

## 2020-08-12 DIAGNOSIS — Z20822 Contact with and (suspected) exposure to covid-19: Secondary | ICD-10-CM | POA: Diagnosis not present

## 2020-08-18 LAB — SARS-COV-2, NAA 2 DAY TAT

## 2020-08-18 LAB — NOVEL CORONAVIRUS, NAA: SARS-CoV-2, NAA: NOT DETECTED

## 2020-09-13 ENCOUNTER — Other Ambulatory Visit: Payer: Self-pay | Admitting: Internal Medicine

## 2020-09-18 ENCOUNTER — Other Ambulatory Visit: Payer: Self-pay

## 2020-09-18 MED ORDER — LEVOTHYROXINE SODIUM 50 MCG PO TABS: 50.0000 ug | ORAL_TABLET | Freq: Every day | ORAL | 0 refills | Status: DC

## 2020-10-10 ENCOUNTER — Ambulatory Visit (INDEPENDENT_AMBULATORY_CARE_PROVIDER_SITE_OTHER): Payer: Medicare PPO

## 2020-10-10 VITALS — Ht 67.5 in | Wt 195.0 lb

## 2020-10-10 DIAGNOSIS — Z0000 Encounter for general adult medical examination without abnormal findings: Secondary | ICD-10-CM | POA: Diagnosis not present

## 2020-10-10 NOTE — Progress Notes (Cosign Needed Addendum)
Subjective:   Joanne Taylor is a 70 y.o. female who presents for Medicare Annual (Subsequent) preventive examination.  Review of Systems    No ROS.  Medicare Wellness Virtual Visit.     Cardiac Risk Factors include: advanced age (>34men, >71 women)     Objective:    Today's Vitals   10/10/20 1044  Weight: 195 lb (88.5 kg)  Height: 5' 7.5" (1.715 m)   Body mass index is 30.09 kg/m.  Advanced Directives 10/10/2020 10/10/2019 10/09/2018 07/18/2018 09/16/2017  Does Patient Have a Medical Advance Directive? Yes Yes Yes No No  Type of Paramedic of Ages;Living will [place];Living will [place];Living will - -  Does patient want to make changes to medical advance directive? No - Patient declined No - Patient declined No - Patient declined - -  Copy of [place] in Chart? No - copy requested No - copy requested No - copy requested - -  Would patient like information on creating a medical advance directive? - - - - No - Patient declined    Current Medications (verified) Outpatient Encounter Medications as of 10/10/2020  Medication Sig   Calcium Carb-Cholecalciferol (CALCIUM 1000 + D) 1000-800 MG-UNIT TABS Take by mouth.   estradiol (ESTRACE) 0.5 MG tablet TAKE ONE TABLET BY MOUTH DAILY   fluticasone (FLONASE) 50 MCG/ACT nasal spray Place 2 sprays into both nostrils daily.   levothyroxine (SYNTHROID) 50 MCG tablet Take 1 tablet (50 mcg total) by mouth daily.   Multiple Vitamin (MULTIVITAMIN) tablet Take 1 tablet by mouth daily.   Probiotic Product (PROBIOTIC FORMULA PO) Take by mouth.   progesterone (PROMETRIUM) 100 MG capsule TAKE ONE CAPSULE BY MOUTH DAILY   [DISCONTINUED] predniSONE (DELTASONE) 10 MG tablet 6 tablets on Day 1 , then reduce by 1 tablet daily until gone   No facility-administered encounter medications on file as of 10/10/2020.    Allergies (verified) Cortisone   History: Past  Medical History:  Diagnosis Date   Basal cell carcinoma (BCC) of right upper arm 11/07/2019   Osteopenia    Past Surgical History:  Procedure Laterality Date   COLONOSCOPY     COLONOSCOPY WITH PROPOFOL N/A 01/09/2016   Procedure: COLONOSCOPY WITH PROPOFOL;  Surgeon: Lollie Sails, MD;  Location: [facility] END[facility]COPY;  Service: Endoscopy;  Laterality: N/A;   TUBAL LIGATION     Family History  Problem Relation Age of Onset   Hypertension Mother    Mental illness Mother        depressive disorder   Congestive Heart Failure Mother    Cancer Father        prostate   Mental illness Father        lewy body Dementia   Hyperlipidemia Father    Breast cancer Neg Hx    Social History   Socioeconomic History   Marital status: Married    Spouse name: Not on file   Number of children: Not on file   Years of education: Not on file   Highest education level: Not on file  Occupational History   Occupation: Asst Garment/textile technologist: acc    Comment: at [employer] Use   Smoking status: Former Smoker    Types: Cigarettes    Quit date: 02/07/1972    Years since quitting: 48.7   Smokeless tobacco: Never Used  Substance and Sexual Activity   Alcohol use: Yes    Alcohol/week: 5.0 standard  drinks    Types: 5 Standard drinks or equivalent per week   Drug use: No   Sexual activity: Not on file  Other Topics Concern   Not on file  Social History Narrative   Not on file   Social Determinants of Health   Financial Resource Strain: Low Risk    Difficulty of Paying Living Expenses: Not hard at all  Food Insecurity: No Food Insecurity   Worried About Charity fundraiser in the Last Year: Never true   [place] in the Last Year: Never true  Transportation Needs: No Transportation Needs   Lack of Transportation (Medical): No   Lack of Transportation (Non-Medical): No  Physical Activity: Insufficiently Active   Days of Exercise per Week: 2 days   Minutes of Exercise per Session: 50 min   Stress: No Stress Concern Present   Feeling of Stress : Not at all  Social Connections: Unknown   Frequency of Communication with Friends and Family: Three times a week   Frequency of Social Gatherings with Friends and Family: Twice a week   Attends Religious Services: Not on Electrical engineer or Organizations: Not on file   Attends Archivist Meetings: Not on file   Marital Status: Not on file    Tobacco Counseling Counseling given: Not Answered   Clinical Intake:  Pre-visit preparation completed: Yes        Diabetes: No  How often do you need to have someone help you when you read instructions, pamphlets, or other written materials from your doctor or pharmacy?: 1 - Never   Interpreter Needed?: No      Activities of Daily Living In your present state of health, do you have any difficulty performing the following activities: 10/10/2020  Hearing? N  Vision? N  Difficulty concentrating or making decisions? N  Walking or climbing stairs? N  Dressing or bathing? N  Doing errands, shopping? N  Preparing Food and eating ? N  Using the Toilet? N  In the past six months, have you accidently leaked urine? N  Comment Managed with daily liner  Do you have problems with loss of bowel control? N  Managing your Medications? N  Managing your Finances? N  Housekeeping or managing your Housekeeping? N  Some recent data might be hidden    Patient Care Team: Crecencio Mc, MD as PCP - General (Internal Medicine)  Indicate any recent Medical Services you may have received from other than [facility] providers in the past year (date may be approximate).     Assessment:   This is a routine wellness examination for Joanne Taylor.  I connected with Joanne Taylor today by telephone and verified that I am speaking with the correct person using two identifiers. Location patient: home Location provider: work Persons participating in the virtual visit: patient, Marine scientist.     I discussed the limitations, risks, security and privacy concerns of performing an evaluation and management service by telephone and the availability of in person appointments. The patient expressed understanding and verbally consented to this telephonic visit.    Interactive audio and video telecommunications were attempted between this provider and patient, however failed, due to patient having technical difficulties OR patient did not have access to video capability.  We continued and completed visit with audio only.  Some vital signs may be absent or patient reported.   Hearing/Vision screen  Hearing Screening   125Hz  250Hz  500Hz  1000Hz  2000Hz  3000Hz  4000Hz  6000Hz   8000Hz   Right ear:           Left ear:           Comments: Patient is able to hear conversational tones without difficulty.  No issues reported.  Vision Screening Comments: Wears corrective lenses  Visual acuity not assessed, virtual visit. They have seen their ophthalmologist in the last 12 months.   Dietary issues and exercise activities discussed: Current Exercise Habits: Home exercise routine, Type of exercise: walking, Intensity: Mild  Healthy diet Good water intake  Goals       Patient Stated     I want to lose another 10-15lbs (pt-stated)      Healthy diet Stay active       Depression Screen PHQ 2/9 Scores 10/10/2020 10/10/2019 10/09/2018 09/16/2017 11/16/2016 11/10/2015 08/07/2012  PHQ - 2 Score 0 0 0 0 0 0 0    Fall Risk Fall Risk  10/10/2020 11/07/2019 10/10/2019 10/09/2018 09/16/2017  Falls in the past year? 0 0 0 1 No  Number falls in past yr: 0 - - - -  Injury with Fall? 0 - - - -  Follow up Falls evaluation completed Falls evaluation completed Falls evaluation completed - -    FALL RISK PREVENTION PERTAINING TO THE HOME: Handrails in use while climbing stairs? Yes Home free of loose throw rugs in walkways, pet beds, electrical cords, etc? Yes  Adequate lighting in your home to reduce risk of falls? Yes    ASSISTIVE DEVICES UTILIZED TO PREVENT FALLS: Use of a cane, walker or w/c? No   TIMED UP AND GO: Was the test performed? No . Virtual visit.   Cognitive Function: Patient is alert and oriented x3.  Denies difficulty focusing, making decisions, memory loss.  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  MMSE/6CIT deferred. Normal by direct communication/observation.  MMSE - Mini Mental State Exam 10/09/2018 09/16/2017  Orientation to time 5 5  Orientation to Place 5 5  Registration 3 3  Attention/ Calculation 5 5  Recall 3 3  Language- name 2 objects 2 2  Language- repeat 1 1  Language- follow 3 step command 3 3  Language- read & follow direction 1 1  Write a sentence 1 1  Copy design 1 1  Total score 30 30     6CIT Screen 10/10/2019  What Year? 0 points  What month? 0 points  What time? 0 points  Count back from 20 0 points  Months in reverse 0 points  Repeat phrase 0 points  Total Score 0    Immunizations Immunization History  Administered Date(s) Administered   Influenza Split 05/23/2012   Influenza, High Dose Seasonal PF 05/24/2018   Influenza-Unspecified 06/24/2013, 05/20/2015, 06/06/2017, 05/16/2019, 05/07/2020   PFIZER(Purple Top)SARS-COV-2 Vaccination 09/18/2019, 10/09/2019, 05/26/2020   Pneumococcal Conjugate-13 05/28/2015   Pneumococcal Polysaccharide-23 11/16/2016   Tdap 02/07/2014   Zoster 11/01/2013   Health Maintenance There are no preventive care reminders to display for this patient. Health Maintenance  Topic Date Due   COVID-19 Vaccine (4 - Booster for Pfizer series) 11/24/2020   MAMMOGRAM  03/26/2021   TETANUS/TDAP  02/08/2024   COLONOSCOPY (Pts 45-53yrs Insurance coverage will need to be confirmed)  01/08/2026   INFLUENZA VACCINE  Completed   DEXA SCAN  Completed   Hepatitis C Screening  Completed   PNA vac Low Risk Adult  Completed   HPV VACCINES  Aged Out   Colorectal cancer screening: Type of  screening:  Colonoscopy. Completed 01/09/16. Repeat every 10 years  Mammogram status: Completed 03/26/20. Repeat every year  Lung Cancer Screening: (Low Dose CT Chest recommended if Age 43-80 years, 30 pack-year currently smoking OR have quit w/in 15years.) does not qualify.   Vision Screening: Recommended annual ophthalmology exams for early detection of glaucoma and other disorders of the eye. Is the patient up to date with their annual eye exam?  Yes  Who is the provider or what is the name of the office in which the patient attends annual eye exams? [place]  Dental Screening: Recommended annual dental exams for proper oral hygiene.  Community Resource Referral / Chronic Care Management: CRR required this visit?  No   CCM required this visit?  No      Plan:   Keep all routine maintenance appointments.   Cpe 11/14/20 @ 8:30  I have personally reviewed and noted the following in the patient's chart:   Medical and social history Use of alcohol, tobacco or illicit drugs  Current medications and supplements Functional ability and status Nutritional status Physical activity Advanced directives List of other physicians Hospitalizations, surgeries, and ER visits in previous 12 months Vitals Screenings to include cognitive, depression, and falls Referrals and appointments  In addition, I have reviewed and discussed with patient certain preventive protocols, quality metrics, and best practice recommendations. A written personalized care plan for preventive services as well as general preventive health recommendations were provided to patient via mychart.     OBrien-Blaney, Denisa L, LPN   12/10/84    I have reviewed the above information and agree with above.   Deborra Medina, MD

## 2020-10-10 NOTE — Patient Instructions (Addendum)
Joanne Taylor , Thank you for taking time to come for your Medicare Wellness Visit. I appreciate your ongoing commitment to your health goals. Please review the following plan we discussed and let me know if I can assist you in the future.   These are the goals we discussed: Goals      Patient Stated   .  I want to lose another 10-15lbs (pt-stated)      Healthy diet Stay active       This is a list of the screening recommended for you and due dates:  Health Maintenance  Topic Date Due  . COVID-19 Vaccine (4 - Booster for Pfizer series) 11/24/2020  . Mammogram  03/26/2021  . Tetanus Vaccine  02/08/2024  . Colon Cancer Screening  01/08/2026  . Flu Shot  Completed  . DEXA scan (bone density measurement)  Completed  .  Hepatitis C: One time screening is recommended by Center for Disease Control  (CDC) for  adults born from [birth year] through 1966.   Completed  . Pneumonia vaccines  Completed  . HPV Vaccine  Aged Out    Immunizations Immunization History  Administered Date(s) Administered  . Influenza Split 05/23/2012  . Influenza, High Dose Seasonal PF 05/24/2018  . Influenza-Unspecified 06/24/2013, 05/20/2015, 06/06/2017, 05/16/2019, 05/07/2020  . PFIZER(Purple Top)SARS-COV-2 Vaccination 09/18/2019, 10/09/2019, 05/26/2020  . Pneumococcal Conjugate-13 05/28/2015  . Pneumococcal Polysaccharide-23 11/16/2016  . Tdap 02/07/2014  . Zoster 11/01/2013   Keep all routine maintenance appointments.   Cpe 11/14/20 @ 8:30  Advanced directives: End of life planning; Advance aging; Advanced directives discussed.  Copy of current HCPOA/Living Will requested.    Conditions/risks identified: none new.   Follow up in one year for your annual wellness visit.   Preventive Care 37 Years and Older, Female Preventive care refers to lifestyle choices and visits with your health care provider that can promote health and wellness. What does preventive care include?  A yearly physical exam. This is  also called an annual well check.  Dental exams once or twice a year.  Routine eye exams. Ask your health care provider how often you should have your eyes checked.  Personal lifestyle choices, including:  Daily care of your teeth and gums.  Regular physical activity.  Eating a healthy diet.  Avoiding tobacco and drug use.  Limiting alcohol use.  Practicing safe sex.  Taking low-dose aspirin every day.  Taking vitamin and mineral supplements as recommended by your health care provider. What happens during an annual well check? The services and screenings done by your health care provider during your annual well check will depend on your age, overall health, lifestyle risk factors, and family history of disease. Counseling  Your health care provider may ask you questions about your:  Alcohol use.  Tobacco use.  Drug use.  Emotional well-being.  Home and relationship well-being.  Sexual activity.  Eating habits.  History of falls.  Memory and ability to understand (cognition).  Work and work Statistician.  Reproductive health. Screening  You may have the following tests or measurements:  Height, weight, and BMI.  Blood pressure.  Lipid and cholesterol levels. These may be checked every 5 years, or more frequently if you are over 13 years old.  Skin check.  Lung cancer screening. You may have this screening every year starting at age 89 if you have a 30-pack-year history of smoking and currently smoke or have quit within the past 15 years.  Fecal occult blood test (FOBT) of  the stool. You may have this test every year starting at age 30.  Flexible sigmoidoscopy or colonoscopy. You may have a sigmoidoscopy every 5 years or a colonoscopy every 10 years starting at age 67.  Hepatitis C blood test.  Hepatitis B blood test.  Sexually transmitted disease (STD) testing.  Diabetes screening. This is done by checking your blood sugar (glucose) after you have  not eaten for a while (fasting). You may have this done every 1-3 years.  Bone density scan. This is done to screen for osteoporosis. You may have this done starting at age 8.  Mammogram. This may be done every 1-2 years. Talk to your health care provider about how often you should have regular mammograms. Talk with your health care provider about your test results, treatment options, and if necessary, the need for more tests. Vaccines  Your health care provider may recommend certain vaccines, such as:  Influenza vaccine. This is recommended every year.  Tetanus, diphtheria, and acellular pertussis (Tdap, Td) vaccine. You may need a Td booster every 10 years.  Zoster vaccine. You may need this after age 47.  Pneumococcal 13-valent conjugate (PCV13) vaccine. One dose is recommended after age 51.  Pneumococcal polysaccharide (PPSV23) vaccine. One dose is recommended after age 73. Talk to your health care provider about which screenings and vaccines you need and how often you need them. This information is not intended to replace advice given to you by your health care provider. Make sure you discuss any questions you have with your health care provider. Document Released: 08/22/2015 Document Revised: 04/14/2016 Document Reviewed: 05/27/2015 Elsevier Interactive Patient Education  2017 [employer] Prevention in the Home Falls can cause injuries. They can happen to people of all ages. There are many things you can do to make your home safe and to help prevent falls. What can I do on the outside of my home?  Regularly fix the edges of walkways and driveways and fix any cracks.  Remove anything that might make you trip as you walk through a door, such as a raised step or threshold.  Trim any bushes or trees on the path to your home.  Use bright outdoor lighting.  Clear any walking paths of anything that might make someone trip, such as rocks or tools.  Regularly check to see  if handrails are loose or broken. Make sure that both sides of any steps have handrails.  Any raised decks and porches should have guardrails on the edges.  Have any leaves, snow, or ice cleared regularly.  Use sand or salt on walking paths during winter.  Clean up any spills in your garage right away. This includes oil or grease spills. What can I do in the bathroom?  Use night lights.  Install grab bars by the toilet and in the tub and shower. Do not use towel bars as grab bars.  Use non-skid mats or decals in the tub or shower.  If you need to sit down in the shower, use a plastic, non-slip stool.  Keep the floor dry. Clean up any water that spills on the floor as soon as it happens.  Remove soap buildup in the tub or shower regularly.  Attach bath mats securely with double-sided non-slip rug tape.  Do not have throw rugs and other things on the floor that can make you trip. What can I do in the bedroom?  Use night lights.  Make sure that you have a light by your  bed that is easy to reach.  Do not use any sheets or blankets that are too big for your bed. They should not hang down onto the floor.  Have a firm chair that has side arms. You can use this for support while you get dressed.  Do not have throw rugs and other things on the floor that can make you trip. What can I do in the kitchen?  Clean up any spills right away.  Avoid walking on wet floors.  Keep items that you use a lot in easy-to-reach places.  If you need to reach something above you, use a strong step stool that has a grab bar.  Keep electrical cords out of the way.  Do not use floor polish or wax that makes floors slippery. If you must use wax, use non-skid floor wax.  Do not have throw rugs and other things on the floor that can make you trip. What can I do with my stairs?  Do not leave any items on the stairs.  Make sure that there are handrails on both sides of the stairs and use them.  Fix handrails that are broken or loose. Make sure that handrails are as long as the stairways.  Check any carpeting to make sure that it is firmly attached to the stairs. Fix any carpet that is loose or worn.  Avoid having throw rugs at the top or bottom of the stairs. If you do have throw rugs, attach them to the floor with carpet tape.  Make sure that you have a light switch at the top of the stairs and the bottom of the stairs. If you do not have them, ask someone to add them for you. What else can I do to help prevent falls?  Wear shoes that:  Do not have high heels.  Have rubber bottoms.  Are comfortable and fit you well.  Are closed at the toe. Do not wear sandals.  If you use a stepladder:  Make sure that it is fully opened. Do not climb a closed stepladder.  Make sure that both sides of the stepladder are locked into place.  Ask someone to hold it for you, if possible.  Clearly mark and make sure that you can see:  Any grab bars or handrails.  First and last steps.  Where the edge of each step is.  Use tools that help you move around (mobility aids) if they are needed. These include:  Canes.  Walkers.  Scooters.  Crutches.  Turn on the lights when you go into a dark area. Replace any light bulbs as soon as they burn out.  Set up your furniture so you have a clear path. Avoid moving your furniture around.  If any of your floors are uneven, fix them.  If there are any pets around you, be aware of where they are.  Review your medicines with your doctor. Some medicines can make you feel dizzy. This can increase your chance of falling. Ask your doctor what other things that you can do to help prevent falls. This information is not intended to replace advice given to you by your health care provider. Make sure you discuss any questions you have with your health care provider. Document Released: 05/22/2009 Document Revised: 01/01/2016 Document Reviewed:  08/30/2014 Elsevier Interactive Patient Education  2017 [employer].

## 2020-11-07 ENCOUNTER — Other Ambulatory Visit: Payer: Self-pay

## 2020-11-07 ENCOUNTER — Encounter: Payer: Self-pay | Admitting: Internal Medicine

## 2020-11-07 ENCOUNTER — Telehealth: Payer: Self-pay | Admitting: Internal Medicine

## 2020-11-07 ENCOUNTER — Ambulatory Visit: Payer: Medicare PPO | Admitting: Internal Medicine

## 2020-11-07 VITALS — BP 126/76 | HR 74 | Temp 98.3°F | Resp 15 | Ht 67.5 in | Wt 195.4 lb

## 2020-11-07 DIAGNOSIS — M8580 Other specified disorders of bone density and structure, unspecified site: Secondary | ICD-10-CM

## 2020-11-07 DIAGNOSIS — Z1231 Encounter for screening mammogram for malignant neoplasm of breast: Secondary | ICD-10-CM

## 2020-11-07 DIAGNOSIS — G43109 Migraine with aura, not intractable, without status migrainosus: Secondary | ICD-10-CM | POA: Diagnosis not present

## 2020-11-07 DIAGNOSIS — R1013 Epigastric pain: Secondary | ICD-10-CM | POA: Diagnosis not present

## 2020-11-07 DIAGNOSIS — Z23 Encounter for immunization: Secondary | ICD-10-CM

## 2020-11-07 DIAGNOSIS — E039 Hypothyroidism, unspecified: Secondary | ICD-10-CM | POA: Diagnosis not present

## 2020-11-07 DIAGNOSIS — E782 Mixed hyperlipidemia: Secondary | ICD-10-CM | POA: Diagnosis not present

## 2020-11-07 DIAGNOSIS — Z0001 Encounter for general adult medical examination with abnormal findings: Secondary | ICD-10-CM | POA: Diagnosis not present

## 2020-11-07 LAB — LIPID PANEL
Cholesterol: 188 mg/dL (ref 0–200)
HDL: 73 mg/dL (ref >39.00)
LDL Cholesterol: 92 mg/dL (ref 0–99)
NonHDL: 114.53
Total CHOL/HDL Ratio: 3
Triglycerides: 113 mg/dL (ref 0.0–149.0)
VLDL: 22.6 mg/dL (ref 0.0–40.0)

## 2020-11-07 LAB — COMPREHENSIVE METABOLIC PANEL
ALT: 15 U/L (ref 0–35)
AST: 17 U/L (ref 0–37)
Albumin: 4.3 g/dL (ref 3.5–5.2)
Alkaline Phosphatase: 80 U/L (ref 39–117)
BUN: 14 mg/dL (ref 6–23)
CO2: 25 mEq/L (ref 19–32)
Calcium: 8.8 mg/dL (ref 8.4–10.5)
Chloride: 106 mEq/L (ref 96–112)
Creatinine, Ser: 0.92 mg/dL (ref 0.40–1.20)
GFR: 63.01 mL/min (ref >60.00)
Glucose, Bld: 101 mg/dL — ABNORMAL HIGH (ref 70–99)
Potassium: 4.3 mEq/L (ref 3.5–5.1)
Sodium: 139 mEq/L (ref 135–145)
Total Bilirubin: 0.7 mg/dL (ref 0.2–1.2)
Total Protein: 6.6 g/dL (ref 6.0–8.3)

## 2020-11-07 LAB — TSH: TSH: 3.47 u[IU]/mL (ref 0.35–4.50)

## 2020-11-07 MED ORDER — ZOSTER VAC RECOMB ADJUVANTED 50 MCG/0.5ML IM SUSR: 0.5000 mL | Freq: Once | INTRAMUSCULAR | 1 refills | Status: AC

## 2020-11-07 MED ORDER — OMEPRAZOLE 40 MG PO CPDR: 40.0000 mg | DELAYED_RELEASE_CAPSULE | Freq: Every day | ORAL | 3 refills | Status: DC

## 2020-11-07 NOTE — Telephone Encounter (Signed)
Patient was returning your call to make appointment

## 2020-11-07 NOTE — Progress Notes (Signed)
Patient ID: Joanne Taylor, female    DOB: Mar 25, 1951  Age: 70 y.o. MRN: [id number]  The patient is here for annual  wellness examination and management of other chronic and acute problems.   The risk factors are reflected in the social history.  The roster of all physicians providing medical care to patient - is listed in the Snapshot section of the chart.  Activities of daily living:  The patient is 100% independent in all ADLs: dressing, toileting, feeding as well as independent mobility  Home safety : The patient has smoke detectors in the home. They wear seatbelts.  There are no firearms at home. There is no violence in the home.   There is no risks for hepatitis, STDs or HIV. There is no   history of blood transfusion. They have no travel history to infectious disease endemic areas of the world.  The patient has seen their dentist in the last six month. They have seen their eye doctor in the last year. She denies  hearing difficulty with regard to whispered voices and some television programs.  They have deferred audiologic testing in the last year.  They do not  have excessive sun exposure. Discussed the need for sun protection: hats, long sleeves and use of sunscreen if there is significant sun exposure.   Diet: the importance of a healthy diet is discussed. They do have a healthy diet.  The benefits of regular aerobic exercise were discussed. She walks 4 times per week ,  20 minutes.   Depression screen: there are no signs or vegative symptoms of depression- irritability, change in appetite, anhedonia, sadness/tearfullness.  Cognitive assessment: the patient manages all their financial and personal affairs and is actively engaged. They could relate day,date,year and events; recalled 2/3 objects at 3 minutes; performed clock-face test normally.  The following portions of the patient's history were reviewed and updated as appropriate: allergies, current medications, past family  history, past medical history,  past surgical history, past social history  and problem list.  Visual acuity was not assessed per patient preference since she has regular follow up with her ophthalmologist. Hearing and body mass index were assessed and reviewed.   During the course of the visit the patient was educated and counseled about appropriate screening and preventive services including : fall prevention , diabetes screening, nutrition counseling, colorectal cancer screening, and recommended immunizations.    CC: The primary encounter diagnosis was Breast cancer screening by mammogram. Diagnoses of Epigastric pain, COVID-19 vaccine administered, Acquired hypothyroidism, Moderate mixed hyperlipidemia not requiring statin therapy, Encounter for general adult medical examination with abnormal findings, Epigastric abdominal pain, Osteopenia, unspecified location, and Ocular migraine were also pertinent to this visit.  1) epigastric pain descrbed as squeezing,  occurred after a big spaghetti meal .. persisted for over an hour, was not accompanied by diaphoresis or jaw/shoulder pain.   Finally  Resolved after taking baking soda and belching.  No regular use of NSAIDS.    Averages 1-2 glasses of wine per night.    2) silent migraine occurred once in 2004.  Has had 2 this year,  Visual phenomena  Described as seeing Shimmering lights that progressed aroudn a circular path , then faded away after 15 minutes.  no vision loss no headache and no nausea    History Joanne Taylor has a past medical history of Basal cell carcinoma (BCC) of right upper arm (11/07/2019) and Osteopenia.   She has a past surgical history that includes Tubal ligation; Colonoscopy;  and Colonoscopy with propofol (N/A, 01/09/2016).   Her family history includes Cancer in her father; Congestive Heart Failure in her mother; Hyperlipidemia in her father; Hypertension in her mother; Mental illness in her father and mother.She reports that she  quit smoking about 48 years ago. Her smoking use included cigarettes. She has never used smokeless tobacco. She reports current alcohol use of about 5.0 standard drinks of alcohol per week. She reports that she does not use drugs.  Outpatient Medications Prior to Visit  Medication Sig Dispense Refill  . Calcium Carb-Cholecalciferol 1000-800 MG-UNIT TABS Take by mouth.    . estradiol (ESTRACE) 0.5 MG tablet TAKE ONE TABLET BY MOUTH DAILY 90 tablet 0  . fluticasone (FLONASE) 50 MCG/ACT nasal spray Place 2 sprays into both nostrils daily. 16 g 6  . levothyroxine (SYNTHROID) 50 MCG tablet Take 1 tablet (50 mcg total) by mouth daily. 90 tablet 0  . Multiple Vitamin (MULTIVITAMIN) tablet Take 1 tablet by mouth daily.    . Probiotic Product (PROBIOTIC FORMULA PO) Take by mouth.    . progesterone (PROMETRIUM) 100 MG capsule TAKE ONE CAPSULE BY MOUTH DAILY 90 capsule 0   No facility-administered medications prior to visit.    Review of Systems  Patient denies headache, fevers, malaise, unintentional weight loss, skin rash, eye pain, sinus congestion and sinus pain, sore throat, dysphagia,  hemoptysis , cough, dyspnea, wheezing, chest pain, palpitations, orthopnea, edema, abdominal pain, nausea, melena, diarrhea, constipation, flank pain, dysuria, hematuria, urinary  Frequency, nocturia, numbness, tingling, seizures,  Focal weakness, Loss of consciousness,  Tremor, insomnia, depression, anxiety, and suicidal ideation.     Objective:  BP 126/76 (BP Location: Left Arm, Patient Position: Sitting, Cuff Size: Normal)   Pulse 74   Temp 98.3 F (36.8 C) (Oral)   Resp 15   Ht 5' 7.5" (1.715 m)   Wt 195 lb 6.4 oz (88.6 kg)   SpO2 99%   BMI 30.15 kg/m   Physical Exam  General appearance: alert, cooperative and appears stated age Head: Normocephalic, without obvious abnormality, atraumatic Eyes: conjunctivae/corneas clear. PERRL, EOM's intact. Fundi benign. Ears: normal TM's and external ear canals  both ears Nose: Nares normal. Septum midline. Mucosa normal. No drainage or sinus tenderness. Throat: lips, mucosa, and tongue normal; teeth and gums normal Neck: no adenopathy, no carotid bruit, no JVD, supple, symmetrical, trachea midline and thyroid not enlarged, symmetric, no tenderness/mass/nodules Lungs: clear to auscultation bilaterally Breasts: normal appearance, no masses or tenderness Heart: regular rate and rhythm, S1, S2 normal, no murmur, click, rub or gallop Abdomen: soft, non-tender; bowel sounds normal; no masses,  no organomegaly Extremities: extremities normal, atraumatic, no cyanosis or edema Pulses: 2+ and symmetric Skin: Skin color, texture, turgor normal. No rashes or lesions Neurologic: Alert and oriented X 3, normal strength and tone. Normal symmetric reflexes. Normal coordination and gait.     Assessment & Plan:   Problem List Items Addressed This Visit      Unprioritized   Acquired hypothyroidism    Thyroid function is WNL on current dose of 50 mcg daily. Marland Kitchen  No changes today   Lab Results  Component Value Date   TSH 3.47 11/07/2020         Relevant Orders   TSH (Completed)   Ocular migraine    NEW ONSET ,  Without headache, dizziness or loss of vision. 2 episodes since last year .  Seen by ophthalmologist . No workup unless frequency increases to weekly or she has neurologic signs  Osteopenia    DEXA reviewed . T scores were normal in 2016.  Repeat due . Marland Kitchen Discussed the current controversies surrounding the risks and benefits of calcium supplementation.  Encouraged her to increase dietary calcium through natural foods including almond/coconut milk,  soymilk and vegetables.       Epigastric abdominal pain    She had a single episode of epigastric pain that occurred after eating a large meal.  Patient is advised to stat taking omeprazole daily and avoiding foods and behaviors that trigger her reflux symptoms.  Behavioral changes discussed  And  advised to not eat within within 2 hours of lying down,,  Avoid overeating,  And avoid chocolate and mint at the end of meal.  US liver/GB ordered to rule out gallstones       Encounter for general adult medical examination with abnormal findings    age appropriate education and counseling updated, referrals for preventative services and immunizations addressed, dietary and smoking counseling addressed, most recent labs reviewed.  I have personally reviewed and have noted:  1) the patient's medical and social history 2) The pt's use of alcohol, tobacco, and illicit drugs 3) The patient's current medications and supplements 4) Functional ability including ADL's, fall risk, home safety risk, hearing and visual impairment 5) Diet and physical activities 6) Evidence for depression or mood disorder 7) The patient's height, weight, and BMI have been recorded in the chart  I have made referrals, and provided counseling and education based on review of the above       Other Visit Diagnoses    Breast cancer screening by mammogram    -  Primary   Relevant Orders   MM 3D SCREEN BREAST BILATERAL   Epigastric pain       Relevant Orders   H Pylori, IGM, IGG, IGA AB   Comprehensive metabolic panel (Completed)   US Abdomen Limited RUQ (LIVER/GB)   COVID-19 vaccine administered       Relevant Orders   SARS-CoV-2 Semi-Quantitative Total Antibody, Spike   Moderate mixed hyperlipidemia not requiring statin therapy       Relevant Orders   Lipid panel (Completed)      I am having Joanne Atlas "Cathy" start on omeprazole and Zoster Vaccine Adjuvanted. I am also having her maintain her multivitamin, Calcium Carb-Cholecalciferol, Probiotic Product (PROBIOTIC FORMULA PO), fluticasone, progesterone, estradiol, and levothyroxine.  Meds ordered this encounter  Medications  . omeprazole (PRILOSEC) 40 MG capsule    Sig: Take 1 capsule (40 mg total) by mouth daily.    Dispense:  30 capsule     Refill:  3  . Zoster Vaccine Adjuvanted [facility]) injection    Sig: Inject 0.5 mLs into the muscle once for 1 dose.    Dispense:  1 each    Refill:  1    There are no discontinued medications.  Follow-up: No follow-ups on file.   Crecencio Mc, MD

## 2020-11-07 NOTE — Patient Instructions (Signed)
You may take the omeprazole once daily in the morning with your thyroid tablet.  Take it for one month  If symptoms return , let me know.  If not ,  Change to famotidine 20 mg (available OTC generic pepcid) pre dinner   Limit alcohol to one glass of wine per night   Liver ultrasound ordered    The ShingRx vaccine is now available in local pharmacies and is much more protective thant Zostavaxs,  It is therefore ADVISED for all interested adults over 50 to prevent shingles   Your annual mammogram has been ordered.  You are encouraged (required) to call to make your appointment at [place] [phone]

## 2020-11-07 NOTE — Telephone Encounter (Signed)
Thank you :)

## 2020-11-09 DIAGNOSIS — G43109 Migraine with aura, not intractable, without status migrainosus: Secondary | ICD-10-CM | POA: Insufficient documentation

## 2020-11-09 DIAGNOSIS — R1013 Epigastric pain: Secondary | ICD-10-CM | POA: Insufficient documentation

## 2020-11-09 NOTE — Assessment & Plan Note (Signed)
Thyroid function is WNL on current dose of 50 mcg daily. Marland Kitchen  No changes today   Lab Results  Component Value Date   TSH 3.47 11/07/2020

## 2020-11-09 NOTE — Assessment & Plan Note (Signed)

## 2020-11-09 NOTE — Assessment & Plan Note (Addendum)
She had a single episode of epigastric pain that occurred after eating a large meal.  Patient is advised to stat taking omeprazole daily and avoiding foods and behaviors that trigger her reflux symptoms.  Behavioral changes discussed  And advised to not eat within within 2 hours of lying down,,  Avoid overeating,  And avoid chocolate and mint at the end of meal.  US liver/GB ordered to rule out gallstones

## 2020-11-09 NOTE — Assessment & Plan Note (Signed)
NEW ONSET ,  Without headache, dizziness or loss of vision. 2 episodes since last year .  Seen by ophthalmologist . No workup unless frequency increases to weekly or she has neurologic signs

## 2020-11-09 NOTE — Assessment & Plan Note (Signed)
DEXA reviewed . T scores were normal in 2016.  Repeat due . Marland Kitchen Discussed the current controversies surrounding the risks and benefits of calcium supplementation.  Encouraged her to increase dietary calcium through natural foods including almond/coconut milk,  soymilk and vegetables.

## 2020-11-11 LAB — H PYLORI, IGM, IGG, IGA AB
H pylori, IgM Abs: <9 units (ref 0.0–8.9)
H. pylori, IgA Abs: <9 units (ref 0.0–8.9)
H. pylori, IgG AbS: 0.35 Index Value (ref 0.00–0.79)

## 2020-11-13 ENCOUNTER — Other Ambulatory Visit: Payer: Self-pay | Admitting: Internal Medicine

## 2020-11-13 LAB — SARS-COV-2 SEMI-QUANTITATIVE TOTAL ANTIBODY, SPIKE: SARS COV2 AB, Total Spike Semi QN: >2500 U/mL — ABNORMAL HIGH (ref <0.8)

## 2020-11-13 MED ORDER — ZOSTER VAC RECOMB ADJUVANTED 50 MCG/0.5ML IM SUSR: 0.5000 mL | Freq: Once | INTRAMUSCULAR | 1 refills | Status: AC

## 2020-11-14 ENCOUNTER — Ambulatory Visit: Payer: Medicare PPO | Admitting: Internal Medicine

## 2020-11-22 DIAGNOSIS — R319 Hematuria, unspecified: Secondary | ICD-10-CM | POA: Diagnosis not present

## 2020-12-11 ENCOUNTER — Other Ambulatory Visit: Payer: Self-pay

## 2020-12-11 ENCOUNTER — Ambulatory Visit
Admission: RE | Admit: 2020-12-11 | Discharge: 2020-12-11 | Disposition: A | Discharge status: Home / Self Care | Payer: Medicare PPO | Source: Ambulatory Visit | Attending: Internal Medicine | Admitting: Internal Medicine

## 2020-12-11 ENCOUNTER — Other Ambulatory Visit: Payer: Self-pay | Admitting: Internal Medicine

## 2020-12-11 DIAGNOSIS — R1013 Epigastric pain: Secondary | ICD-10-CM | POA: Diagnosis not present

## 2020-12-11 DIAGNOSIS — K760 Fatty (change of) liver, not elsewhere classified: Secondary | ICD-10-CM | POA: Diagnosis not present

## 2020-12-17 ENCOUNTER — Other Ambulatory Visit: Payer: Self-pay | Admitting: Internal Medicine

## 2020-12-17 DIAGNOSIS — K760 Fatty (change of) liver, not elsewhere classified: Secondary | ICD-10-CM

## 2020-12-17 NOTE — Assessment & Plan Note (Signed)
SUGGESTED BY ULTRASOUND .  Lifestyle modification ,  Vaccination against Hep A/B advised.  GI referral deferred by patient.

## 2021-01-02 ENCOUNTER — Encounter: Payer: Self-pay | Admitting: Internal Medicine

## 2021-01-13 DIAGNOSIS — D2262 Melanocytic nevi of left upper limb, including shoulder: Secondary | ICD-10-CM | POA: Diagnosis not present

## 2021-01-13 DIAGNOSIS — Z85828 Personal history of other malignant neoplasm of skin: Secondary | ICD-10-CM | POA: Diagnosis not present

## 2021-01-13 DIAGNOSIS — D2271 Melanocytic nevi of right lower limb, including hip: Secondary | ICD-10-CM | POA: Diagnosis not present

## 2021-01-13 DIAGNOSIS — D225 Melanocytic nevi of trunk: Secondary | ICD-10-CM | POA: Diagnosis not present

## 2021-01-13 DIAGNOSIS — D2261 Melanocytic nevi of right upper limb, including shoulder: Secondary | ICD-10-CM | POA: Diagnosis not present

## 2021-01-13 DIAGNOSIS — I872 Venous insufficiency (chronic) (peripheral): Secondary | ICD-10-CM | POA: Diagnosis not present

## 2021-01-13 DIAGNOSIS — X32XXXA Exposure to sunlight, initial encounter: Secondary | ICD-10-CM | POA: Diagnosis not present

## 2021-01-13 DIAGNOSIS — L570 Actinic keratosis: Secondary | ICD-10-CM | POA: Diagnosis not present

## 2021-03-17 ENCOUNTER — Other Ambulatory Visit: Payer: Self-pay | Admitting: Internal Medicine

## 2021-03-19 ENCOUNTER — Other Ambulatory Visit: Payer: Self-pay | Admitting: Internal Medicine

## 2021-04-03 ENCOUNTER — Ambulatory Visit
Admission: RE | Admit: 2021-04-03 | Discharge: 2021-04-03 | Disposition: A | Discharge status: Home / Self Care | Payer: Medicare PPO | Source: Ambulatory Visit | Attending: Internal Medicine | Admitting: Internal Medicine

## 2021-04-03 ENCOUNTER — Other Ambulatory Visit: Payer: Self-pay

## 2021-04-03 DIAGNOSIS — Z1231 Encounter for screening mammogram for malignant neoplasm of breast: Secondary | ICD-10-CM | POA: Diagnosis not present

## 2021-06-05 ENCOUNTER — Encounter: Payer: Self-pay | Admitting: Internal Medicine

## 2021-06-05 ENCOUNTER — Other Ambulatory Visit: Payer: Self-pay

## 2021-06-05 ENCOUNTER — Ambulatory Visit: Payer: Medicare PPO | Admitting: Internal Medicine

## 2021-06-05 VITALS — BP 120/72 | HR 80 | Temp 97.9°F | Resp 16 | Ht 67.5 in | Wt 196.0 lb

## 2021-06-05 DIAGNOSIS — E039 Hypothyroidism, unspecified: Secondary | ICD-10-CM | POA: Diagnosis not present

## 2021-06-05 DIAGNOSIS — K760 Fatty (change of) liver, not elsewhere classified: Secondary | ICD-10-CM

## 2021-06-05 DIAGNOSIS — R7301 Impaired fasting glucose: Secondary | ICD-10-CM | POA: Diagnosis not present

## 2021-06-05 MED ORDER — ZOSTER VAC RECOMB ADJUVANTED 50 MCG/0.5ML IM SUSR: 0.5000 mL | Freq: Once | INTRAMUSCULAR | 1 refills | Status: AC

## 2021-06-05 NOTE — Progress Notes (Signed)
Subjective:  Patient ID: Joanne Taylor, female    DOB: [birth year]  Age: 70 y.o. MRN: [id number]  CC: The primary encounter diagnosis was Acquired hypothyroidism. Diagnoses of Fatty liver disease, nonalcoholic and Impaired fasting blood sugar were also pertinent to this visit.  HPI Joanne Taylor presents for follow up on epigastric pain and fatty liver. Chief Complaint  Patient presents with   Follow-up    Gall bladder    This visit occurred during the SARS-CoV-2 public health emergency.  Safety protocols were in place, including screening questions prior to the visit, additional usage of staff PPE, and extensive cleaning of exam room while observing appropriate contact time as indicated for disinfecting solutions.   Fatty liver  :  suggested by recent liver ultrasound.  She has had a weight gain of  5 lbs in 5 weeks  due to due to husband's critical injury 5 weeks ago.  He sustained a Burst fracture of C1 while cycling in [facility]  on a bike path which was managed with use of a cervical Collar  and activity restriction until Dec 15 so she has been responsible for  managing everything and eating more fast food.     GERD:  she stopping taking omeprazole due to cramping and diarrhea .  Her symptoms are infrequent .  Discussed use of famotidine 20 mg bid prn    Outpatient Medications Prior to Visit  Medication Sig Dispense Refill   Calcium Carb-Cholecalciferol 1000-800 MG-UNIT TABS Take by mouth.     estradiol (ESTRACE) 0.5 MG tablet TAKE ONE TABLET BY MOUTH DAILY 90 tablet 0   fluticasone (FLONASE) 50 MCG/ACT nasal spray Place 2 sprays into both nostrils daily. 16 g 6   levothyroxine (SYNTHROID) 50 MCG tablet TAKE ONE TABLET BY MOUTH DAILY 90 tablet 0   Multiple Vitamin (MULTIVITAMIN) tablet Take 1 tablet by mouth daily.     Probiotic Product (PROBIOTIC FORMULA PO) Take by mouth.     progesterone (PROMETRIUM) 100 MG capsule TAKE ONE CAPSULE BY MOUTH DAILY 90 capsule 0    omeprazole (PRILOSEC) 40 MG capsule Take 1 capsule (40 mg total) by mouth daily. 30 capsule 3   No facility-administered medications prior to visit.    Review of Systems;  Patient denies headache, fevers, malaise, unintentional weight loss, skin rash, eye pain, sinus congestion and sinus pain, sore throat, dysphagia,  hemoptysis , cough, dyspnea, wheezing, chest pain, palpitations, orthopnea, edema, abdominal pain, nausea, melena, diarrhea, constipation, flank pain, dysuria, hematuria, urinary  Frequency, nocturia, numbness, tingling, seizures,  Focal weakness, Loss of consciousness,  Tremor, insomnia, depression, anxiety, and suicidal ideation.      Objective:  BP 120/72   Pulse 80   Temp 97.9 F (36.6 C) (Oral)   Resp 16   Ht 5' 7.5" (1.715 m)   Wt 196 lb (88.9 kg)   SpO2 98%   BMI 30.24 kg/m   BP Readings from Last 3 Encounters:  06/05/21 120/72  11/07/20 126/76  11/07/19 116/76    Wt Readings from Last 3 Encounters:  06/05/21 196 lb (88.9 kg)  11/07/20 195 lb 6.4 oz (88.6 kg)  10/10/20 195 lb (88.5 kg)    General appearance: alert, cooperative and appears stated age Ears: normal TM's and external ear canals both ears Throat: lips, mucosa, and tongue normal; teeth and gums normal Neck: no adenopathy, no carotid bruit, supple, symmetrical, trachea midline and thyroid not enlarged, symmetric, no tenderness/mass/nodules Back: symmetric, no curvature. ROM normal. No CVA tenderness.  Lungs: clear to auscultation bilaterally Heart: regular rate and rhythm, S1, S2 normal, no murmur, click, rub or gallop Abdomen: soft, non-tender; bowel sounds normal; no masses,  no organomegaly Pulses: 2+ and symmetric Skin: Skin color, texture, turgor normal. No rashes or lesions Lymph nodes: Cervical, supraclavicular, and axillary nodes normal.  No results found for: HGBA1C  Lab Results  Component Value Date   CREATININE 0.92 11/07/2020   CREATININE 0.89 11/07/2019   CREATININE 0.93  01/12/2019    Lab Results  Component Value Date   WBC 6.3 11/07/2019   HGB 14.1 11/07/2019   HCT 42.4 11/07/2019   PLT 255.0 11/07/2019   GLUCOSE 101 (H) 11/07/2020   CHOL 188 11/07/2020   TRIG 113.0 11/07/2020   HDL 73.00 11/07/2020   LDLCALC 92 11/07/2020   ALT 15 11/07/2020   AST 17 11/07/2020   NA 139 11/07/2020   K 4.3 11/07/2020   CL 106 11/07/2020   CREATININE 0.92 11/07/2020   BUN 14 11/07/2020   CO2 25 11/07/2020   TSH 3.47 11/07/2020    MM 3D SCREEN BREAST BILATERAL  Result Date: 04/07/2021 CLINICAL DATA:  Screening. EXAM: DIGITAL SCREENING BILATERAL MAMMOGRAM WITH TOMOSYNTHESIS AND CAD TECHNIQUE: Bilateral screening digital craniocaudal and mediolateral oblique mammograms were obtained. Bilateral screening digital breast tomosynthesis was performed. The images were evaluated with computer-aided detection. COMPARISON:  Previous exam(s). ACR Breast Density Category b: There are scattered areas of fibroglandular density. FINDINGS: There are no findings suspicious for malignancy. IMPRESSION: No mammographic evidence of malignancy. A result letter of this screening mammogram will be mailed directly to the patient. RECOMMENDATION: Screening mammogram in one year. (Code:SM-B-01Y) BI-RADS CATEGORY  1: Negative. Electronically Signed   By: Ileana Roup M.D.   On: 04/07/2021 17:01   Assessment & Plan:   Problem List Items Addressed This Visit     Acquired hypothyroidism - Primary   Relevant Orders   TSH   Fatty liver disease, nonalcoholic    Reviewed the management for fatty liver .  She has deferred statins and metformin  But will start vitamin D 800 Ius daily . Hepatitis A and B vaccination recommended   Lab Results  Component Value Date   ALT 15 11/07/2020   AST 17 11/07/2020   ALKPHOS 80 11/07/2020   BILITOT 0.7 11/07/2020         Relevant Orders   Comprehensive metabolic panel   Lipid panel   Other Visit Diagnoses     Impaired fasting blood sugar        Relevant Orders   Hemoglobin A1c       I provided  30 minutes  during this encounter reviewing patient's current problems and past surgeries, labs and imaging studies, providing counseling on the above mentioned problems I na face to face visit  , and coordination  of care .    Meds ordered this encounter  Medications   Zoster Vaccine Adjuvanted [facility]) injection    Sig: Inject 0.5 mLs into the muscle once for 1 dose.    Dispense:  1 each    Refill:  1    Medications Discontinued During This Encounter  Medication Reason   omeprazole (PRILOSEC) 40 MG capsule Patient Preference    Follow-up: No follow-ups on file.   Crecencio Mc, MD

## 2021-06-05 NOTE — Patient Instructions (Signed)
We discussed Vitamin E  metformin and cholesterol medication  The vitamin E dose is 800 Ius daily  Consider Hepatitis A and vaccination   RETURN IN 6 MONTHS   MODERATION I N  ALL THINGS!

## 2021-06-07 ENCOUNTER — Encounter: Payer: Self-pay | Admitting: Internal Medicine

## 2021-06-07 NOTE — Assessment & Plan Note (Addendum)
Reviewed the management for fatty liver .  She has deferred statins and metformin  But will start vitamin D 800 Ius daily . Hepatitis A and B vaccination recommended   Lab Results  Component Value Date   ALT 15 11/07/2020   AST 17 11/07/2020   ALKPHOS 80 11/07/2020   BILITOT 0.7 11/07/2020

## 2021-06-14 ENCOUNTER — Other Ambulatory Visit: Payer: Self-pay | Admitting: Internal Medicine

## 2021-06-16 ENCOUNTER — Other Ambulatory Visit: Payer: Self-pay | Admitting: Internal Medicine

## 2021-07-27 ENCOUNTER — Encounter: Payer: Self-pay | Admitting: Internal Medicine

## 2021-09-10 ENCOUNTER — Other Ambulatory Visit: Payer: Self-pay | Admitting: Internal Medicine

## 2021-09-11 ENCOUNTER — Other Ambulatory Visit: Payer: Self-pay | Admitting: Internal Medicine

## 2021-09-15 ENCOUNTER — Encounter: Payer: Self-pay | Admitting: Internal Medicine

## 2021-10-13 ENCOUNTER — Ambulatory Visit (INDEPENDENT_AMBULATORY_CARE_PROVIDER_SITE_OTHER): Payer: Medicare PPO

## 2021-10-13 VITALS — Ht 67.5 in | Wt 186.0 lb

## 2021-10-13 DIAGNOSIS — Z0000 Encounter for general adult medical examination without abnormal findings: Secondary | ICD-10-CM

## 2021-10-13 NOTE — Progress Notes (Addendum)
Subjective:   Joanne Taylor is a 71 y.o. female who presents for Medicare Annual (Subsequent) preventive examination.  Review of Systems    No ROS.  Medicare Wellness Virtual Visit.  Visual/audio telehealth visit, UTA vital signs.   See social history for additional risk factors.   Cardiac Risk Factors include: advanced age (>27men, >16 women)     Objective:    Today's Vitals   10/13/21 1034  Weight: 186 lb (84.4 kg)  Height: 5' 7.5" (1.715 m)   Body mass index is 28.7 kg/m.  Advanced Directives 10/13/2021 10/10/2020 10/10/2019 10/09/2018 07/18/2018 09/16/2017  Does Patient Have a Medical Advance Directive? Yes Yes Yes Yes No No  Type of Estate agent of [place];Living will Healthcare Power of [place];Living will Healthcare Power of [place];Living will Healthcare Power of [place];Living will - -  Does patient want to make changes to medical advance directive? No - Patient declined No - Patient declined No - Patient declined No - Patient declined - -  Copy of Healthcare Power of Attorney in Chart? No - copy requested No - copy requested No - copy requested No - copy requested - -  Would patient like information on creating a medical advance directive? - - - - - No - Patient declined    Current Medications (verified) Outpatient Encounter Medications as of 10/13/2021  Medication Sig   Calcium Carb-Cholecalciferol 1000-800 MG-UNIT TABS Take by mouth.   estradiol (ESTRACE) 0.5 MG tablet TAKE ONE TABLET BY MOUTH DAILY   fluticasone (FLONASE) 50 MCG/ACT nasal spray Place 2 sprays into both nostrils daily.   levothyroxine (SYNTHROID) 50 MCG tablet TAKE ONE TABLET BY MOUTH DAILY   Multiple Vitamin (MULTIVITAMIN) tablet Take 1 tablet by mouth daily.   Probiotic Product (PROBIOTIC FORMULA PO) Take by mouth.   progesterone (PROMETRIUM) 100 MG capsule TAKE ONE CAPSULE BY MOUTH DAILY   No facility-administered encounter medications on file as of 10/13/2021.     Allergies (verified) Cortisone and Omeprazole   History: Past Medical History:  Diagnosis Date   Basal cell carcinoma (BCC) of right upper arm 11/07/2019   Osteopenia    Past Surgical History:  Procedure Laterality Date   COLONOSCOPY     COLONOSCOPY WITH PROPOFOL N/A 01/09/2016   Procedure: COLONOSCOPY WITH PROPOFOL;  Surgeon: Christena Deem, MD;  Location[facility] ENDOSCOPY;  Service: Endoscopy;  Laterality: N/A;   TUBAL LIGATION     Family History  Problem Relation Age of Onset   Hypertension Mother    Mental illness Mother        depressive disorder   Congestive Heart Failure Mother    Cancer Father        prostate   Mental illness Father        lewy body Dementia   Hyperlipidemia Father    Breast cancer Neg Hx    Social History   Socioeconomic History   Marital status: Married    Spouse name: Not on file   Number of children: Not on file   Years of education: Not on file   Highest education level: Not on file  Occupational History   Occupation: Asst Film/video editor: acc    Comment: at [employer]  Tobacco Use   Smoking status: Former    Types: Cigarettes    Quit date: 02/07/1972    Years since quitting: 49.7   Smokeless tobacco: Never  Substance and Sexual Activity   Alcohol use: Yes    Alcohol/week: 5.0  standard drinks    Types: 5 Standard drinks or equivalent per week   Drug use: No   Sexual activity: Not on file  Other Topics Concern   Not on file  Social History Narrative   Not on file   Social Determinants of Health   Financial Resource Strain: Low Risk    Difficulty of Paying Living Expenses: Not hard at all  Food Insecurity: No Food Insecurity   Worried About Programme researcher, broadcasting/film/video in the Last Year: Never true   Ran Out of Food in the Last Year: Never true  Transportation Needs: No Transportation Needs   Lack of Transportation (Medical): No   Lack of Transportation (Non-Medical): No  Physical Activity: Sufficiently Active   Days of Exercise per  Week: 5 days   Minutes of Exercise per Session: 30 min  Stress: No Stress Concern Present   Feeling of Stress : Not at all  Social Connections: Unknown   Frequency of Communication with Friends and Family: Three times a week   Frequency of Social Gatherings with Friends and Family: Twice a week   Attends Religious Services: Not on Scientist, clinical (histocompatibility and immunogenetics) or Organizations: Not on file   Attends Banker Meetings: Not on file   Marital Status: Not on file    Tobacco Counseling Counseling given: Not Answered   Clinical Intake:  Pre-visit preparation completed: Yes        Diabetes: No  How often do you need to have someone help you when you read instructions, pamphlets, or other written materials from your doctor or pharmacy?: 1 - Never  Interpreter Needed?: No      Activities of Daily Living In your present state of health, do you have any difficulty performing the following activities: 10/13/2021  Hearing? N  Vision? N  Difficulty concentrating or making decisions? N  Walking or climbing stairs? N  Dressing or bathing? N  Doing errands, shopping? N  Preparing Food and eating ? N  Using the Toilet? N  In the past six months, have you accidently leaked urine? N  Do you have problems with loss of bowel control? N  Managing your Medications? N  Managing your Finances? N  Housekeeping or managing your Housekeeping? N  Some recent data might be hidden    Patient Care Team: Sherlene Shams, MD as PCP - General (Internal Medicine)  Indicate any recent Medical Services you may have received from other than [facility] providers in the past year (date may be approximate).     Assessment:   This is a routine wellness examination for Joanne Taylor.  Virtual Visit via Telephone Note  I connected with  Joanne Taylor on 10/13/21 at 10:30 AM EST by telephone and verified that I am speaking with the correct person using two identifiers.  Persons participating  in the virtual visit: patient/Nurse Health Advisor   I discussed the limitations, risks, security and privacy concerns of performing an evaluation and management service by telephone and the availability of in person appointments. The patient expressed understanding and agreed to proceed.  Interactive audio and video telecommunications were attempted between this nurse and patient, however failed, due to patient having technical difficulties OR patient did not have access to video capability.  We continued and completed visit with audio only.  Some vital signs may be absent or patient reported.   Hearing/Vision screen Hearing Screening - Comments:: Patient is able to hear conversational tones without difficulty. No issues  reported. Vision Screening - Comments:: Followed by Dr. Alvester Morin  Wears corrective lenses  Annual visits   Dietary issues and exercise activities discussed: Current Exercise Habits: Home exercise routine, Type of exercise: strength training/weights;walking, Time (Minutes): 15, Frequency (Times/Week): 5, Weekly Exercise (Minutes/Week): 75, Intensity: Mild Healthy diet Good water intake   Goals Addressed               This Visit's Progress     Patient Stated     Weight goal 175lb (pt-stated)        Portion control meals Reduce sugar intake Walk for exercise        Depression Screen PHQ 2/9 Scores 10/13/2021 11/07/2020 10/10/2020 10/10/2019 10/09/2018 09/16/2017 11/16/2016  PHQ - 2 Score 0 0 0 0 0 0 0    Fall Risk Fall Risk  10/13/2021 11/07/2020 10/10/2020 11/07/2019 10/10/2019  Falls in the past year? 0 1 0 0 0  Number falls in past yr: 0 0 0 - -  Injury with Fall? - 0 0 - -  Follow up Falls evaluation completed Falls evaluation completed Falls evaluation completed Falls evaluation completed Falls evaluation completed    FALL RISK PREVENTION PERTAINING TO THE HOME: Home free of loose throw rugs in walkways, pet beds, electrical cords, etc? Yes  Adequate lighting in your home  to reduce risk of falls? Yes   ASSISTIVE DEVICES UTILIZED TO PREVENT FALLS: Life alert? No  Use of a cane, walker or w/c? No   TIMED UP AND GO: Was the test performed? No .   Cognitive Function: Patient is alert and oriented x3.  Enjoys wordle, puzzles, Sunday [employer] and other brain health stimulating activities.  MMSE - Mini Mental State Exam 10/09/2018 09/16/2017  Orientation to time 5 5  Orientation to Place 5 5  Registration 3 3  Attention/ Calculation 5 5  Recall 3 3  Language- name 2 objects 2 2  Language- repeat 1 1  Language- follow 3 step command 3 3  Language- read & follow direction 1 1  Write a sentence 1 1  Copy design 1 1  Total score 30 30     6CIT Screen 10/10/2019  What Year? 0 points  What month? 0 points  What time? 0 points  Count back from 20 0 points  Months in reverse 0 points  Repeat phrase 0 points  Total Score 0    Immunizations Immunization History  Administered Date(s) Administered   Influenza Split 05/23/2012   Influenza, High Dose Seasonal PF 05/24/2018   Influenza-Unspecified 06/24/2013, 05/20/2015, 05/16/2019, 05/07/2020, 05/18/2021   PFIZER(Purple Top)SARS-COV-2 Vaccination 09/18/2019, 10/09/2019, 05/26/2020, 01/09/2021   Pneumococcal Conjugate-13 05/28/2015   Pneumococcal Polysaccharide-23 11/16/2016   Tdap 02/07/2014   Zoster Recombinat (Shingrix) 09/15/2021   Zoster, Live 11/01/2013   Screening Tests Health Maintenance  Topic Date Due   COVID-19 Vaccine (5 - Booster for Pfizer series) 03/06/2021   Zoster Vaccines- Shingrix (2 of 2) 11/10/2021   MAMMOGRAM  04/03/2022   TETANUS/TDAP  02/08/2024   COLONOSCOPY (Pts 45-28yrs Insurance coverage will need to be confirmed)  01/08/2026   Pneumonia Vaccine 49+ Years old  Completed   INFLUENZA VACCINE  Completed   DEXA SCAN  Completed   Hepatitis C Screening  Completed   HPV VACCINES  Aged Out   Health Maintenance Health Maintenance Due  Topic Date Due   COVID-19 Vaccine  (5 - Booster for Pfizer series) 03/06/2021   Lung Cancer Screening: (Low Dose CT Chest recommended if Age 68-80  years, 30 pack-year currently smoking OR have quit w/in 15years.) does not qualify.   Vision Screening: Recommended annual ophthalmology exams for early detection of glaucoma and other disorders of the eye.  Dental Screening: Recommended annual dental exams for proper oral hygiene  Community Resource Referral / Chronic Care Management: CRR required this visit?  No   CCM required this visit?  No      Plan:   Keep all routine maintenance appointments.   I have personally reviewed and noted the following in the patient's chart:   Medical and social history Use of alcohol, tobacco or illicit drugs  Current medications and supplements including opioid prescriptions.  Functional ability and status Nutritional status Physical activity Advanced directives List of other physicians Hospitalizations, surgeries, and ER visits in previous 12 months Vitals Screenings to include cognitive, depression, and falls Referrals and appointments  In addition, I have reviewed and discussed with patient certain preventive protocols, quality metrics, and best practice recommendations. A written personalized care plan for preventive services as well as general preventive health recommendations were provided to patient via mychart.     OBrien-Blaney, Deontre Allsup L, LPN   08/14/1094    I have reviewed the above information and agree with above.   Duncan Dull, MD

## 2021-10-13 NOTE — Patient Instructions (Addendum)
Joanne Taylor , Thank you for taking time to come for your Medicare Wellness Visit. I appreciate your ongoing commitment to your health goals. Please review the following plan we discussed and let me know if I can assist you in the future.   These are the goals we discussed:  Goals       Patient Stated     Weight goal 175lb (pt-stated)      Portion control meals Reduce sugar intake Walk for exercise         This is a list of the screening recommended for you and due dates:  Health Maintenance  Topic Date Due   COVID-19 Vaccine (5 - Booster for Pfizer series) 03/06/2021   Zoster (Shingles) Vaccine (2 of 2) 11/10/2021   Mammogram  04/03/2022   Tetanus Vaccine  02/08/2024   Colon Cancer Screening  01/08/2026   Pneumonia Vaccine  Completed   Flu Shot  Completed   DEXA scan (bone density measurement)  Completed   Hepatitis C Screening: USPSTF Recommendation to screen - Ages 41-79 yo.  Completed   HPV Vaccine  Aged Out    Advanced directives: End of life planning; Advance aging; Advanced directives discussed.  Copy of current HCPOA/Living Will requested.    Conditions/risks identified: none new  Follow up in one year for your annual wellness visit    Preventive Care 65 Years and Older, Female Preventive care refers to lifestyle choices and visits with your health care provider that can promote health and wellness. What does preventive care include? A yearly physical exam. This is also called an annual well check. Dental exams once or twice a year. Routine eye exams. Ask your health care provider how often you should have your eyes checked. Personal lifestyle choices, including: Daily care of your teeth and gums. Regular physical activity. Eating a healthy diet. Avoiding tobacco and drug use. Limiting alcohol use. Practicing safe sex. Taking low-dose aspirin every day. Taking vitamin and mineral supplements as recommended by your health care provider. What happens during  an annual well check? The services and screenings done by your health care provider during your annual well check will depend on your age, overall health, lifestyle risk factors, and family history of disease. Counseling  Your health care provider may ask you questions about your: Alcohol use. Tobacco use. Drug use. Emotional well-being. Home and relationship well-being. Sexual activity. Eating habits. History of falls. Memory and ability to understand (cognition). Work and work Statistician. Reproductive health. Screening  You may have the following tests or measurements: Height, weight, and BMI. Blood pressure. Lipid and cholesterol levels. These may be checked every 5 years, or more frequently if you are over 41 years old. Skin check. Lung cancer screening. You may have this screening every year starting at age 64 if you have a 30-pack-year history of smoking and currently smoke or have quit within the past 15 years. Fecal occult blood test (FOBT) of the stool. You may have this test every year starting at age 87. Flexible sigmoidoscopy or colonoscopy. You may have a sigmoidoscopy every 5 years or a colonoscopy every 10 years starting at age 55. Hepatitis C blood test. Hepatitis B blood test. Sexually transmitted disease (STD) testing. Diabetes screening. This is done by checking your blood sugar (glucose) after you have not eaten for a while (fasting). You may have this done every 1-3 years. Bone density scan. This is done to screen for osteoporosis. You may have this done starting at age 7.  Mammogram. This may be done every 1-2 years. Talk to your health care provider about how often you should have regular mammograms. Talk with your health care provider about your test results, treatment options, and if necessary, the need for more tests. Vaccines  Your health care provider may recommend certain vaccines, such as: Influenza vaccine. This is recommended every year. Tetanus,  diphtheria, and acellular pertussis (Tdap, Td) vaccine. You may need a Td booster every 10 years. Zoster vaccine. You may need this after age 69. Pneumococcal 13-valent conjugate (PCV13) vaccine. One dose is recommended after age 61. Pneumococcal polysaccharide (PPSV23) vaccine. One dose is recommended after age 16. Talk to your health care provider about which screenings and vaccines you need and how often you need them. This information is not intended to replace advice given to you by your health care provider. Make sure you discuss any questions you have with your health care provider. Document Released: 08/22/2015 Document Revised: 04/14/2016 Document Reviewed: 05/27/2015 Elsevier Interactive Patient Education  2017 [place] Prevention in the Home Falls can cause injuries. They can happen to people of all ages. There are many things you can do to make your home safe and to help prevent falls. What can I do on the outside of my home? Regularly fix the edges of walkways and driveways and fix any cracks. Remove anything that might make you trip as you walk through a door, such as a raised step or threshold. Trim any bushes or trees on the path to your home. Use bright outdoor lighting. Clear any walking paths of anything that might make someone trip, such as rocks or tools. Regularly check to see if handrails are loose or broken. Make sure that both sides of any steps have handrails. Any raised decks and porches should have guardrails on the edges. Have any leaves, snow, or ice cleared regularly. Use sand or salt on walking paths during winter. Clean up any spills in your garage right away. This includes oil or grease spills. What can I do in the bathroom? Use night lights. Install grab bars by the toilet and in the tub and shower. Do not use towel bars as grab bars. Use non-skid mats or decals in the tub or shower. If you need to sit down in the shower, use a plastic,  non-slip stool. Keep the floor dry. Clean up any water that spills on the floor as soon as it happens. Remove soap buildup in the tub or shower regularly. Attach bath mats securely with double-sided non-slip rug tape. Do not have throw rugs and other things on the floor that can make you trip. What can I do in the bedroom? Use night lights. Make sure that you have a light by your bed that is easy to reach. Do not use any sheets or blankets that are too big for your bed. They should not hang down onto the floor. Have a firm chair that has side arms. You can use this for support while you get dressed. Do not have throw rugs and other things on the floor that can make you trip. What can I do in the kitchen? Clean up any spills right away. Avoid walking on wet floors. Keep items that you use a lot in easy-to-reach places. If you need to reach something above you, use a strong step stool that has a grab bar. Keep electrical cords out of the way. Do not use floor polish or wax that makes floors slippery. If  you must use wax, use non-skid floor wax. Do not have throw rugs and other things on the floor that can make you trip. What can I do with my stairs? Do not leave any items on the stairs. Make sure that there are handrails on both sides of the stairs and use them. Fix handrails that are broken or loose. Make sure that handrails are as long as the stairways. Check any carpeting to make sure that it is firmly attached to the stairs. Fix any carpet that is loose or worn. Avoid having throw rugs at the top or bottom of the stairs. If you do have throw rugs, attach them to the floor with carpet tape. Make sure that you have a light switch at the top of the stairs and the bottom of the stairs. If you do not have them, ask someone to add them for you. What else can I do to help prevent falls? Wear shoes that: Do not have high heels. Have rubber bottoms. Are comfortable and fit you well. Are closed  at the toe. Do not wear sandals. If you use a stepladder: Make sure that it is fully opened. Do not climb a closed stepladder. Make sure that both sides of the stepladder are locked into place. Ask someone to hold it for you, if possible. Clearly mark and make sure that you can see: Any grab bars or handrails. First and last steps. Where the edge of each step is. Use tools that help you move around (mobility aids) if they are needed. These include: Canes. Walkers. Scooters. Crutches. Turn on the lights when you go into a dark area. Replace any light bulbs as soon as they burn out. Set up your furniture so you have a clear path. Avoid moving your furniture around. If any of your floors are uneven, fix them. If there are any pets around you, be aware of where they are. Review your medicines with your doctor. Some medicines can make you feel dizzy. This can increase your chance of falling. Ask your doctor what other things that you can do to help prevent falls. This information is not intended to replace advice given to you by your health care provider. Make sure you discuss any questions you have with your health care provider. Document Released: 05/22/2009 Document Revised: 01/01/2016 Document Reviewed: 08/30/2014 Elsevier Interactive Patient Education  2017 [employer].

## 2021-11-04 ENCOUNTER — Encounter: Payer: Self-pay | Admitting: Internal Medicine

## 2021-11-09 ENCOUNTER — Encounter: Payer: Self-pay | Admitting: Internal Medicine

## 2021-11-09 ENCOUNTER — Ambulatory Visit (INDEPENDENT_AMBULATORY_CARE_PROVIDER_SITE_OTHER): Payer: Medicare PPO | Admitting: Internal Medicine

## 2021-11-09 VITALS — BP 102/74 | HR 69 | Temp 97.7°F | Ht 67.75 in | Wt 184.0 lb

## 2021-11-09 DIAGNOSIS — R7301 Impaired fasting glucose: Secondary | ICD-10-CM | POA: Diagnosis not present

## 2021-11-09 DIAGNOSIS — E039 Hypothyroidism, unspecified: Secondary | ICD-10-CM | POA: Diagnosis not present

## 2021-11-09 DIAGNOSIS — E663 Overweight: Secondary | ICD-10-CM

## 2021-11-09 DIAGNOSIS — Z1231 Encounter for screening mammogram for malignant neoplasm of breast: Secondary | ICD-10-CM

## 2021-11-09 DIAGNOSIS — K760 Fatty (change of) liver, not elsewhere classified: Secondary | ICD-10-CM | POA: Diagnosis not present

## 2021-11-09 DIAGNOSIS — Z780 Asymptomatic menopausal state: Secondary | ICD-10-CM

## 2021-11-09 DIAGNOSIS — Z0000 Encounter for general adult medical examination without abnormal findings: Secondary | ICD-10-CM | POA: Diagnosis not present

## 2021-11-09 LAB — LIPID PANEL
Cholesterol: 175 mg/dL (ref 0–200)
HDL: 68 mg/dL (ref >39.00)
LDL Cholesterol: 83 mg/dL (ref 0–99)
NonHDL: 106.58
Total CHOL/HDL Ratio: 3
Triglycerides: 116 mg/dL (ref 0.0–149.0)
VLDL: 23.2 mg/dL (ref 0.0–40.0)

## 2021-11-09 LAB — COMPREHENSIVE METABOLIC PANEL
ALT: 12 U/L (ref 0–35)
AST: 20 U/L (ref 0–37)
Albumin: 4.2 g/dL (ref 3.5–5.2)
Alkaline Phosphatase: 76 U/L (ref 39–117)
BUN: 15 mg/dL (ref 6–23)
CO2: 24 mEq/L (ref 19–32)
Calcium: 9.2 mg/dL (ref 8.4–10.5)
Chloride: 105 mEq/L (ref 96–112)
Creatinine, Ser: 0.89 mg/dL (ref 0.40–1.20)
GFR: 65.11 mL/min (ref >60.00)
Glucose, Bld: 96 mg/dL (ref 70–99)
Potassium: 4 mEq/L (ref 3.5–5.1)
Sodium: 138 mEq/L (ref 135–145)
Total Bilirubin: 0.7 mg/dL (ref 0.2–1.2)
Total Protein: 6.8 g/dL (ref 6.0–8.3)

## 2021-11-09 LAB — HEMOGLOBIN A1C: Hgb A1c MFr Bld: 5.8 % (ref 4.6–6.5)

## 2021-11-09 LAB — TSH: TSH: 3.64 u[IU]/mL (ref 0.35–5.50)

## 2021-11-09 NOTE — Progress Notes (Signed)
Patient ID: Joanne Taylor, female    DOB: June 30, 1951  Age: 71 y.o. MRN: [id number] ? ?The patient is here for annual PREVENTIVE examination and management of other chronic and acute problems. ?  ?The risk factors are reflected in the social history. ? ?The roster of all physicians providing medical care to patient - is listed in the Snapshot section of the chart. ? ?Activities of daily living:  The patient is 100% independent in all ADLs: dressing, toileting, feeding as well as independent mobility ? ?Home safety : The patient has smoke detectors in the home. They wear seatbelts.  There are no firearms at home. There is no violence in the home.  ? ?There is no risks for hepatitis, STDs or HIV. There is no   history of blood transfusion. They have no travel history to infectious disease endemic areas of the world. ? ?The patient has seen their dentist in the last six month. They have seen their eye doctor in the last year. They admit to slight hearing difficulty with regard to whispered voices and some television programs.  They have deferred audiologic testing in the last year.  They do not  have excessive sun exposure. Discussed the need for sun protection: hats, long sleeves and use of sunscreen if there is significant sun exposure.  ? ?Diet: the importance of a healthy diet is discussed. They do have a healthy diet. ? ?The benefits of regular aerobic exercise were discussed. She walks 4 times per week ,  20 minutes.  ? ?Depression screen: there are no signs or vegative symptoms of depression- irritability, change in appetite, anhedonia, sadness/tearfullness. ? ?Cognitive assessment: the patient manages all their financial and personal affairs and is actively engaged. They could relate day,date,year and events; recalled 2/3 objects at 3 minutes; performed clock-face test normally. ? ?The following portions of the patient's history were reviewed and updated as appropriate: allergies, current medications, past  family history, past medical history,  past surgical history, past social history  and problem list. ? ?Visual acuity was not assessed per patient preference since she has regular follow up with her ophthalmologist. Hearing and body mass index were assessed and reviewed.  ? ?During the course of the visit the patient was educated and counseled about appropriate screening and preventive services including : fall prevention , diabetes screening, nutrition counseling, colorectal cancer screening, and recommended immunizations.   ? ?CC: The primary encounter diagnosis was Breast cancer screening by mammogram. Diagnoses of Acquired hypothyroidism, Fatty liver disease, nonalcoholic, Impaired fasting blood sugar, Postmenopausal estrogen deficiency, Routine physical examination, and Overweight (BMI 25.0-29.9) were also pertinent to this visit. ? ?History ?Joanne Taylor has a past medical history of Basal cell carcinoma (BCC) of right upper arm (11/07/2019) and Osteopenia.  ? ?She has a past surgical history that includes Tubal ligation; Colonoscopy; and Colonoscopy with propofol (N/A, 01/09/2016).  ? ?Her family history includes Cancer in her father; Congestive Heart Failure in her mother; Hyperlipidemia in her father; Hypertension in her mother; Mental illness in her father and mother.She reports that she quit smoking about 49 years ago. Her smoking use included cigarettes. She has never used smokeless tobacco. She reports current alcohol use of about 5.0 standard drinks per week. She reports that she does not use drugs. ? ?Outpatient Medications Prior to Visit  ?Medication Sig Dispense Refill  ? Calcium Carb-Cholecalciferol 1000-800 MG-UNIT TABS Take by mouth.    ? estradiol (ESTRACE) 0.5 MG tablet TAKE ONE TABLET BY MOUTH DAILY 90 tablet 0  ?  levothyroxine (SYNTHROID) 50 MCG tablet TAKE ONE TABLET BY MOUTH DAILY 90 tablet 0  ? Multiple Vitamin (MULTIVITAMIN) tablet Take 1 tablet by mouth daily.    ? Probiotic Product (PROBIOTIC  FORMULA PO) Take by mouth.    ? progesterone (PROMETRIUM) 100 MG capsule TAKE ONE CAPSULE BY MOUTH DAILY 90 capsule 0  ? vitamin E 180 MG (400 UNITS) capsule     ? fluticasone (FLONASE) 50 MCG/ACT nasal spray Place 2 sprays into both nostrils daily. (Patient not taking: Reported on 11/09/2021) 16 g 6  ? ?No facility-administered medications prior to visit.  ? ? ?Review of Systems ? ?Patient denies headache, fevers, malaise, unintentional weight loss, skin rash, eye pain, sinus congestion and sinus pain, sore throat, dysphagia,  hemoptysis , cough, dyspnea, wheezing, chest pain, palpitations, orthopnea, edema, abdominal pain, nausea, melena, diarrhea, constipation, flank pain, dysuria, hematuria, urinary  Frequency, nocturia, numbness, tingling, seizures,  Focal weakness, Loss of consciousness,  Tremor, insomnia, depression, anxiety, and suicidal ideation.   ? ? ?Objective:  ?BP 102/74 (BP Location: Left Arm, Patient Position: Sitting, Cuff Size: Normal)   Pulse 69   Temp 97.7 ?F (36.5 ?C) (Oral)   Ht 5' 7.75" (1.721 m)   Wt 184 lb (83.5 kg)   SpO2 98%   BMI 28.18 kg/m?  ? ?Physical Exam ? ?General appearance: alert, cooperative and appears stated age ?Head: Normocephalic, without obvious abnormality, atraumatic ?Eyes: conjunctivae/corneas clear. PERRL, EOM's intact. Fundi benign. ?Ears: normal TM's and external ear canals both ears ?Nose: Nares normal. Septum midline. Mucosa normal. No drainage or sinus tenderness. ?Throat: lips, mucosa, and tongue normal; teeth and gums normal ?Neck: no adenopathy, no carotid bruit, no JVD, supple, symmetrical, trachea midline and thyroid not enlarged, symmetric, no tenderness/mass/nodules ?Lungs: clear to auscultation bilaterally ?Breasts: normal appearance, no masses or tenderness ?Heart: regular rate and rhythm, S1, S2 normal, no murmur, click, rub or gallop ?Abdomen: soft, non-tender; bowel sounds normal; no masses,  no organomegaly ?Extremities: extremities normal,  atraumatic, no cyanosis or edema ?Pulses: 2+ and symmetric ?Skin: Skin color, texture, turgor normal. No rashes or lesions ?Neurologic: Alert and oriented X 3, normal strength and tone. Normal symmetric reflexes. Normal coordination and gait.    ? ?Assessment & Plan:  ? ?Problem List Items Addressed This Visit   ? ? Routine physical examination  ?  age appropriate education and counseling updated, referrals for preventative services and immunizations addressed, dietary and smoking counseling addressed, most recent labs reviewed.  I have personally reviewed and have noted: ?  ?1) the patient's medical and social history ?2) The pt's use of alcohol, tobacco, and illicit drugs ?3) The patient's current medications and supplements ?4) Functional ability including ADL's, fall risk, home safety risk, hearing and visual impairment ?5) Diet and physical activities ?6) Evidence for depression or mood disorder ?7) The patient's height, weight, and BMI have been recorded in the chart  ?I have made referrals, and provided counseling and education based on review of the above ?  ?  ? Overweight (BMI 25.0-29.9)  ?  I have congratulated her in reduction of   BMI and encouraged  Continued maintenance of weight loss with portions size reduction and regular exercise.  ?  ?  ? Fatty liver disease, nonalcoholic  ?  Reviewed the management for fatty liver .  She has deferred statins and metformin  But will start vitamin D 800 Ius daily . Hepatitis A and B vaccination again  recommended  ? ?Lab Results  ?Component  Value Date  ? ALT 12 11/09/2021  ? AST 20 11/09/2021  ? ALKPHOS 76 11/09/2021  ? BILITOT 0.7 11/09/2021  ? ? ?  ?  ? Acquired hypothyroidism  ? ?Other Visit Diagnoses   ? ? Breast cancer screening by mammogram    -  Primary  ? Relevant Orders  ? MM 3D SCREEN BREAST BILATERAL  ? Impaired fasting blood sugar      ? Postmenopausal estrogen deficiency      ? Relevant Orders  ? DG Bone Density  ? ?  ? ? ?I have discontinued  Joanne Taylor "Cathy"'s fluticasone. I am also having her maintain her multivitamin, Calcium Carb-Cholecalciferol, Probiotic Product (PROBIOTIC FORMULA PO), levothyroxine, estradiol, progesterone, and vitamin E. ?

## 2021-11-09 NOTE — Patient Instructions (Signed)
DEXA SCAN has been ordered for bone density ?Check your vitamin E dose  (800 ius max daily) ? ?Check with insurance about the copay on TwinRx (hepatitis A/B) vaccine series  ? ?Get an annual eye exam ? ? ? ? ? ? ?

## 2021-11-09 NOTE — Assessment & Plan Note (Signed)

## 2021-11-09 NOTE — Assessment & Plan Note (Signed)
I have congratulated her in reduction of   BMI and encouraged  Continued maintenance of weight loss with portions size reduction and regular exercise.  ?

## 2021-11-09 NOTE — Assessment & Plan Note (Signed)
Reviewed the management for fatty liver .  She has deferred statins and metformin  But will start vitamin D 800 Ius daily . Hepatitis A and B vaccination again  recommended  ? ?Lab Results  ?Component Value Date  ? ALT 12 11/09/2021  ? AST 20 11/09/2021  ? ALKPHOS 76 11/09/2021  ? BILITOT 0.7 11/09/2021  ? ? ?

## 2021-11-24 ENCOUNTER — Encounter: Payer: Self-pay | Admitting: Internal Medicine

## 2021-12-07 DIAGNOSIS — H2513 Age-related nuclear cataract, bilateral: Secondary | ICD-10-CM | POA: Diagnosis not present

## 2021-12-09 ENCOUNTER — Other Ambulatory Visit: Payer: Self-pay | Admitting: Internal Medicine

## 2022-01-04 ENCOUNTER — Encounter: Payer: Self-pay | Admitting: Internal Medicine

## 2022-01-06 MED ORDER — AZITHROMYCIN 500 MG PO TABS: 500.0000 mg | ORAL_TABLET | Freq: Every day | ORAL | 0 refills | Status: DC

## 2022-01-06 MED ORDER — PREDNISONE 10 MG PO TABS: ORAL_TABLET | ORAL | 0 refills | Status: DC

## 2022-01-06 MED ORDER — MOLNUPIRAVIR EUA 200MG CAPSULE: 4.0000 | ORAL_CAPSULE | Freq: Two times a day (BID) | ORAL | 0 refills | Status: AC

## 2022-01-14 DIAGNOSIS — D2262 Melanocytic nevi of left upper limb, including shoulder: Secondary | ICD-10-CM | POA: Diagnosis not present

## 2022-01-14 DIAGNOSIS — D2272 Melanocytic nevi of left lower limb, including hip: Secondary | ICD-10-CM | POA: Diagnosis not present

## 2022-01-14 DIAGNOSIS — X32XXXA Exposure to sunlight, initial encounter: Secondary | ICD-10-CM | POA: Diagnosis not present

## 2022-01-14 DIAGNOSIS — D2261 Melanocytic nevi of right upper limb, including shoulder: Secondary | ICD-10-CM | POA: Diagnosis not present

## 2022-01-14 DIAGNOSIS — Z85828 Personal history of other malignant neoplasm of skin: Secondary | ICD-10-CM | POA: Diagnosis not present

## 2022-01-14 DIAGNOSIS — L570 Actinic keratosis: Secondary | ICD-10-CM | POA: Diagnosis not present

## 2022-03-08 ENCOUNTER — Other Ambulatory Visit: Payer: Self-pay | Admitting: Internal Medicine

## 2022-04-20 ENCOUNTER — Ambulatory Visit
Admission: RE | Admit: 2022-04-20 | Discharge: 2022-04-20 | Disposition: A | Discharge status: Home / Self Care | Payer: Medicare PPO | Source: Ambulatory Visit | Attending: Internal Medicine | Admitting: Internal Medicine

## 2022-04-20 DIAGNOSIS — Z1231 Encounter for screening mammogram for malignant neoplasm of breast: Secondary | ICD-10-CM | POA: Insufficient documentation

## 2022-04-20 DIAGNOSIS — Z780 Asymptomatic menopausal state: Secondary | ICD-10-CM | POA: Insufficient documentation

## 2022-05-02 ENCOUNTER — Encounter: Payer: Self-pay | Admitting: Internal Medicine

## 2022-05-03 NOTE — Telephone Encounter (Signed)
Immunizations have been updated in chart.

## 2022-07-28 ENCOUNTER — Encounter: Payer: Self-pay | Admitting: Internal Medicine

## 2022-08-10 ENCOUNTER — Ambulatory Visit: Payer: Medicare PPO | Admitting: Internal Medicine

## 2022-08-13 ENCOUNTER — Encounter: Payer: Self-pay | Admitting: Internal Medicine

## 2022-08-13 ENCOUNTER — Ambulatory Visit: Payer: Medicare PPO | Admitting: Internal Medicine

## 2022-08-13 VITALS — BP 128/64 | HR 66 | Temp 97.6°F | Ht 68.0 in | Wt 186.8 lb

## 2022-08-13 DIAGNOSIS — I4719 Other supraventricular tachycardia: Secondary | ICD-10-CM | POA: Diagnosis not present

## 2022-08-13 DIAGNOSIS — K760 Fatty (change of) liver, not elsewhere classified: Secondary | ICD-10-CM

## 2022-08-13 DIAGNOSIS — I471 Supraventricular tachycardia, unspecified: Secondary | ICD-10-CM | POA: Insufficient documentation

## 2022-08-13 DIAGNOSIS — E039 Hypothyroidism, unspecified: Secondary | ICD-10-CM

## 2022-08-13 DIAGNOSIS — I517 Cardiomegaly: Secondary | ICD-10-CM

## 2022-08-13 LAB — COMPREHENSIVE METABOLIC PANEL
ALT: 15 U/L (ref 0–35)
AST: 21 U/L (ref 0–37)
Albumin: 4.3 g/dL (ref 3.5–5.2)
Alkaline Phosphatase: 84 U/L (ref 39–117)
BUN: 19 mg/dL (ref 6–23)
CO2: 28 mEq/L (ref 19–32)
Calcium: 9.4 mg/dL (ref 8.4–10.5)
Chloride: 103 mEq/L (ref 96–112)
Creatinine, Ser: 0.94 mg/dL (ref 0.40–1.20)
GFR: 60.65 mL/min (ref >60.00)
Glucose, Bld: 114 mg/dL — ABNORMAL HIGH (ref 70–99)
Potassium: 3.8 mEq/L (ref 3.5–5.1)
Sodium: 139 mEq/L (ref 135–145)
Total Bilirubin: 0.6 mg/dL (ref 0.2–1.2)
Total Protein: 6.6 g/dL (ref 6.0–8.3)

## 2022-08-13 LAB — CBC WITH DIFFERENTIAL/PLATELET
Basophils Absolute: 0 10*3/uL (ref 0.0–0.1)
Basophils Relative: 0.5 % (ref 0.0–3.0)
Eosinophils Absolute: 0.1 10*3/uL (ref 0.0–0.7)
Eosinophils Relative: 0.8 % (ref 0.0–5.0)
HCT: 41.8 % (ref 36.0–46.0)
Hemoglobin: 14.2 g/dL (ref 12.0–15.0)
Lymphocytes Relative: 24.2 % (ref 12.0–46.0)
Lymphs Abs: 1.8 10*3/uL (ref 0.7–4.0)
MCHC: 33.9 g/dL (ref 30.0–36.0)
MCV: 90.4 fl (ref 78.0–100.0)
Monocytes Absolute: 0.4 10*3/uL (ref 0.1–1.0)
Monocytes Relative: 5.4 % (ref 3.0–12.0)
Neutro Abs: 5.3 10*3/uL (ref 1.4–7.7)
Neutrophils Relative %: 69.1 % (ref 43.0–77.0)
Platelets: 296 10*3/uL (ref 150.0–400.0)
RBC: 4.62 Mil/uL (ref 3.87–5.11)
RDW: 14.1 % (ref 11.5–15.5)
WBC: 7.6 10*3/uL (ref 4.0–10.5)

## 2022-08-13 LAB — TSH: TSH: 3.69 u[IU]/mL (ref 0.35–5.50)

## 2022-08-13 NOTE — Progress Notes (Unsigned)
Subjective:  Patient ID: Joanne Taylor, female    DOB: 07-05-1951  Age: 72 y.o. MRN: [id number]  CC: There were no encounter diagnoses.   HPI Joanne Taylor presents for  Chief Complaint  Patient presents with   cardiology referral   Has had 2 episodes of tachycardia   1) Oct 20th first episode occurred at a college reunion. Had been drinking alcohol and eating.  Apple watch record hr of 126 for several hours .  No symptoms,  specifically no chest pain or dizziness.  Had consumed 3 glasses  of wine "at least" ,  during the evening.  No caffeine since breakfast, possibly a dessert with chocolate.  Felt fine the next morning, but had a viral infection 2 days later.   2) Dec 18th: had several [employer] followed by a fw glasses of wine at a progressive dinner party that spnned several hours.  Ate a lot Felt bloated,   came took a hot bath,  went to bed. At 11:00 noted pulse  was 106 to 116 while lying down , had several normal BMs over the next 12 hours  3) Dec 31:  similar intake of etoh during the day,  noticed pulse was higher than normal that evening  Was physically active walking and swimming until October and has never had an episode during exericse  No use of decongestants or stimulants other than morning coffee.     Outpatient Medications Prior to Visit  Medication Sig Dispense Refill   azithromycin (ZITHROMAX) 500 MG tablet Take 1 tablet (500 mg total) by mouth daily. 7 tablet 0   Calcium Carb-Cholecalciferol 1000-800 MG-UNIT TABS Take by mouth.     estradiol (ESTRACE) 0.5 MG tablet TAKE ONE TABLET BY MOUTH DAILY 90 tablet 1   levothyroxine (SYNTHROID) 50 MCG tablet TAKE ONE TABLET BY MOUTH DAILY 90 tablet 1   Multiple Vitamin (MULTIVITAMIN) tablet Take 1 tablet by mouth daily.     predniSONE (DELTASONE) 10 MG tablet 6 tablets on Day 1 , then reduce by 1 tablet daily until gone 21 tablet 0   Probiotic Product (PROBIOTIC FORMULA PO) Take by mouth.      progesterone (PROMETRIUM) 100 MG capsule TAKE ONE CAPSULE BY MOUTH DAILY 90 capsule 1   vitamin E 180 MG (400 UNITS) capsule      No facility-administered medications prior to visit.    Review of Systems;  Patient denies headache, fevers, malaise, unintentional weight loss, skin rash, eye pain, sinus congestion and sinus pain, sore throat, dysphagia,  hemoptysis , cough, dyspnea, wheezing, chest pain, palpitations, orthopnea, edema, abdominal pain, nausea, melena, diarrhea, constipation, flank pain, dysuria, hematuria, urinary  Frequency, nocturia, numbness, tingling, seizures,  Focal weakness, Loss of consciousness,  Tremor, insomnia, depression, anxiety, and suicidal ideation.      Objective:  BP 128/64   Pulse 66   Temp 97.6 F (36.4 C) (Oral)   Ht '5\' 8"'$  (1.727 m)   Wt 186 lb 12.8 oz (84.7 kg)   SpO2 99%   BMI 28.40 kg/m   BP Readings from Last 3 Encounters:  08/13/22 128/64  11/09/21 102/74  06/05/21 120/72    Wt Readings from Last 3 Encounters:  08/13/22 186 lb 12.8 oz (84.7 kg)  11/09/21 184 lb (83.5 kg)  10/13/21 186 lb (84.4 kg)    Physical Exam  Lab Results  Component Value Date   HGBA1C 5.8 11/09/2021    Lab Results  Component Value Date   CREATININE 0.89 11/09/2021  CREATININE 0.92 11/07/2020   CREATININE 0.89 11/07/2019    Lab Results  Component Value Date   WBC 6.3 11/07/2019   HGB 14.1 11/07/2019   HCT 42.4 11/07/2019   PLT 255.0 11/07/2019   GLUCOSE 96 11/09/2021   CHOL 175 11/09/2021   TRIG 116.0 11/09/2021   HDL 68.00 11/09/2021   LDLCALC 83 11/09/2021   ALT 12 11/09/2021   AST 20 11/09/2021   NA 138 11/09/2021   K 4.0 11/09/2021   CL 105 11/09/2021   CREATININE 0.89 11/09/2021   BUN 15 11/09/2021   CO2 24 11/09/2021   TSH 3.64 11/09/2021   HGBA1C 5.8 11/09/2021    MM 3D SCREEN BREAST BILATERAL  Result Date: 04/21/2022 CLINICAL DATA:  Screening. EXAM: DIGITAL SCREENING BILATERAL MAMMOGRAM WITH TOMOSYNTHESIS AND CAD TECHNIQUE:  Bilateral screening digital craniocaudal and mediolateral oblique mammograms were obtained. Bilateral screening digital breast tomosynthesis was performed. The images were evaluated with computer-aided detection. COMPARISON:  Previous exam(s). ACR Breast Density Category b: There are scattered areas of fibroglandular density. FINDINGS: There are no findings suspicious for malignancy. IMPRESSION: No mammographic evidence of malignancy. A result letter of this screening mammogram will be mailed directly to the patient. RECOMMENDATION: Screening mammogram in one year. (Code:SM-B-01Y) BI-RADS CATEGORY  1: Negative. Electronically Signed   By: Lillia Mountain M.D.   On: 04/21/2022 11:17   DG Bone Density  Result Date: 04/20/2022 EXAM: DUAL X-RAY ABSORPTIOMETRY (DXA) FOR BONE MINERAL DENSITY IMPRESSION: Your patient Joanne Taylor completed a BMD test on 04/20/2022 using the [employer] (software version: 14.10) manufactured by [employer]. The following summarizes the results of our evaluation. Technologist: SCE PATIENT BIOGRAPHICAL: Name: Joanne, Taylor Patient ID: [id number] Birth Date: [birth year] Height: 67.8 in. Gender: Female Exam Date: 04/20/2022 Weight: 188.4 lbs. Indications: Advanced Age, Caucasian, Height Loss, Hypothyroid, Osteopenia, Postmenopausal Fractures: Treatments: calcium w/ vit D, Levothyroxine, Multi-Vitamin DENSITOMETRY RESULTS: Site      Region     Measured Date Measured Age WHO Classification Young Adult T-score BMD         %Change vs. Previous Significant Change (*) AP Spine L1-L4 3/20/2021 72.1 Normal 0.1 1.208 g/cm2 5.8% Yes AP Spine L1-L4 06/11/2015 65.2 Normal -0.4 1.142 g/cm2 - - DualFemur Neck Right 04/20/2022 72.1 Normal -0.7 0.947 g/cm2 -5.9% Yes DualFemur Neck Right 06/11/2015 65.2 Normal -0.2 1.006 g/cm2 - - DualFemur Total Mean 04/20/2022 72.1 Normal 0.0 1.004 g/cm2 -2.6% Yes DualFemur Total Mean 06/11/2015 65.2 Normal 0.2 1.031 g/cm2 - - ASSESSMENT: The  BMD measured at Femur Neck Right is 0.947 g/cm2 with a T-score of -0.7. This patient is considered normal according to [facility]) criteria. The scan quality is good. Compared with prior study, there has been a significant increase in the spine. Compared with prior study, there has been a significant decrease in the total hip. World Pharmac[facility]) criteria for post-menopausal, Caucasian Women: Normal:                   T-score at or above -1 SD Osteopenia/low bone mass: T-score between -1 and -2.5 SD Osteoporosis:             T-score at or below -2.5 SD RECOMMENDATIONS: 1. All patients should optimize calcium and vitamin D intake. 2. Consider FDA-approved medical therapies in postmenopausal women and men aged 16 years and older, based on the following: a. A hip or vertebral(clinical or morphometric) fracture b. T-score < -2.5 at the femoral neck or  spine after appropriate evaluation to exclude secondary causes c. Low bone mass (T-score between -1.0 and -2.5 at the femoral neck or spine) and a 10-year probability of a hip fracture > 3% or a 10-year probability of a major osteoporosis-related fracture > 20% based on the [place]-adapted WHO algorithm 3. Clinician judgment and/or patient preferences may indicate treatment for people with 10-year fracture probabilities above or below these levels FOLLOW-UP: People with diagnosed cases of osteoporosis or at high risk for fracture should have regular bone mineral density tests. For patients eligible for Medicare, routine testing is allowed once every 2 years. The testing frequency can be increased to one year for patients who have rapidly progressing disease, those who are receiving or discontinuing medical therapy to restore bone mass, or have additional risk factors. I have reviewed this report, and agree with the above findings. [facility] Electronically Signed   By: Franki Cabot M.D.   On: 04/20/2022 09:52    Assessment &  Plan:  .There are no diagnoses linked to this encounter.   I provided 30 minutes of face-to-face time during this encounter reviewing patient's last visit with me, patient's  most recent visit with cardiology,  nephrology,  and neurology,  recent surgical and non surgical procedures, previous  labs and imaging studies, counseling on currently addressed issues,  and post visit ordering to diagnostics and therapeutics .   Follow-up: No follow-ups on file.   Crecencio Mc, MD

## 2022-08-13 NOTE — Patient Instructions (Signed)
YOUR RUNS OF TACHYCARDIA ARE LIKELY DIET RELATED (FOOD AND ETOH)  REDUCE ALCOHOL TO ONE DRINK DAILY. IF YOU "MUST" DRINK MORE:  LIMIT ALCOHOL INTAKE TO ONE DRINK PER HOUR (AND DRINK A NON ALCOHOLIC BEVERAGE IN BETWEEN  IF THE LABS ARE NORMAL TODAY,  I WILL ORDER  A ZIO MONITOR (PORTABLE TELEMETRY) FOR YOU TO WEAR

## 2022-08-13 NOTE — Assessment & Plan Note (Addendum)
I have ordered and reviewed a 12 lead EKG and find that there are no acute changes and patient is in sinus rhythm.  She meets criteria for LVH, which was also seen on 2017 EKGm but she  has no history of HTN and baseline pulse is in the 60's.  She has no murmur on exam to suggest AS. Heart size was normal on 2017 chest x ray.   Normal thyroid lytes,  cbc  today . Recommend alcohol abstinence or at least reducing intake to one beverage per night (given concurrent fatty liver dx),  and further evaluation with  ECHO and ZIO monitor.  Cardiology referral made.

## 2022-08-14 NOTE — Assessment & Plan Note (Signed)
Thyroid function is WNL on current dose of 50 mcg daily. Marland Kitchen  No changes today   Lab Results  Component Value Date   TSH 3.69 08/13/2022

## 2022-08-14 NOTE — Assessment & Plan Note (Signed)
Advised to reduce ETOH intake to one drink daily

## 2022-09-08 ENCOUNTER — Other Ambulatory Visit: Payer: Self-pay | Admitting: Internal Medicine

## 2022-09-19 DIAGNOSIS — J0100 Acute maxillary sinusitis, unspecified: Secondary | ICD-10-CM | POA: Diagnosis not present

## 2022-09-19 DIAGNOSIS — Z6828 Body mass index (BMI) 28.0-28.9, adult: Secondary | ICD-10-CM | POA: Diagnosis not present

## 2022-09-28 ENCOUNTER — Ambulatory Visit: Payer: Medicare PPO | Admitting: Cardiology

## 2022-09-28 ENCOUNTER — Encounter: Payer: Self-pay | Admitting: Internal Medicine

## 2022-09-28 NOTE — Progress Notes (Unsigned)
  Tomasita Morrow, NP-C Phone: [phone]  Joanne Taylor is a 72 y.o. female who presents today for sinus problems.  Patient was seen at Urgent Care for sinusitis on 09/19/2022 and treated with Augmentin 875 BID x 5 days. She reports  Respiratory illness:  Cough- ***  Congestion- ***   Sinus- ***   Chest- ***  Post nasal drip- ***  Sore throat- ***  Shortness of breath- ***  Fever- ***  Fatigue/Myalgia- *** Headache- *** Nausea/Vomiting- *** Taste disturbance- ***  Smell disturbance- ***  Covid exposure- ***  Covid vaccination- ***  Flu vaccination- ***  Medications- ***   Social History   Tobacco Use  Smoking Status Former   Types: Cigarettes   Quit date: 02/07/1972   Years since quitting: 50.6  Smokeless Tobacco Never    Current Outpatient Medications on File Prior to Visit  Medication Sig Dispense Refill   azithromycin (ZITHROMAX) 500 MG tablet Take 1 tablet (500 mg total) by mouth daily. 7 tablet 0   Calcium Carb-Cholecalciferol 1000-800 MG-UNIT TABS Take by mouth.     estradiol (ESTRACE) 0.5 MG tablet TAKE 1 TABLET BY MOUTH DAILY 90 tablet 1   levothyroxine (SYNTHROID) 50 MCG tablet TAKE 1 TABLET BY MOUTH DAILY 90 tablet 1   Multiple Vitamin (MULTIVITAMIN) tablet Take 1 tablet by mouth daily.     predniSONE (DELTASONE) 10 MG tablet 6 tablets on Day 1 , then reduce by 1 tablet daily until gone 21 tablet 0   Probiotic Product (PROBIOTIC FORMULA PO) Take by mouth.     progesterone (PROMETRIUM) 100 MG capsule TAKE 1 CAPSULE BY MOUTH DAILY 90 capsule 1   vitamin E 180 MG (400 UNITS) capsule      No current facility-administered medications on file prior to visit.     ROS see history of present illness  Objective  Physical Exam There were no vitals filed for this visit.  BP Readings from Last 3 Encounters:  08/13/22 128/64  11/09/21 102/74  06/05/21 120/72   Wt Readings from Last 3 Encounters:  08/13/22 186 lb 12.8 oz (84.7 kg)  11/09/21 184 lb  (83.5 kg)  10/13/21 186 lb (84.4 kg)    Physical Exam   Assessment/Plan: Please see individual problem list.  There are no diagnoses linked to this encounter.   Health Maintenance: ***  No follow-ups on file.   Tomasita Morrow, NP-C [place]

## 2022-09-29 ENCOUNTER — Encounter: Payer: Self-pay | Admitting: Nurse Practitioner

## 2022-09-29 ENCOUNTER — Ambulatory Visit: Payer: Medicare PPO | Admitting: Nurse Practitioner

## 2022-09-29 VITALS — BP 122/66 | HR 81 | Temp 98.1°F | Ht 68.0 in | Wt 193.4 lb

## 2022-09-29 DIAGNOSIS — J069 Acute upper respiratory infection, unspecified: Secondary | ICD-10-CM | POA: Diagnosis not present

## 2022-09-29 MED ORDER — METHYLPREDNISOLONE 4 MG PO TBPK: ORAL_TABLET | ORAL | 0 refills | Status: DC

## 2022-09-29 NOTE — Assessment & Plan Note (Addendum)
Likely lingering from previous sinus infection and inflammation. Do not feel that she needs more antibiotics at this time. Will treat with MDP. Advised OTC Mucinex and nasal sprays to help with congestion and ear fullness. Encouraged adequate hydration.

## 2022-10-05 ENCOUNTER — Ambulatory Visit (INDEPENDENT_AMBULATORY_CARE_PROVIDER_SITE_OTHER): Payer: Medicare PPO

## 2022-10-05 ENCOUNTER — Ambulatory Visit: Payer: Medicare PPO | Attending: Cardiology | Admitting: Cardiovascular Disease

## 2022-10-05 ENCOUNTER — Encounter: Payer: Self-pay | Admitting: Cardiovascular Disease

## 2022-10-05 VITALS — BP 120/80 | HR 87 | Ht 68.0 in | Wt 188.4 lb

## 2022-10-05 DIAGNOSIS — R002 Palpitations: Secondary | ICD-10-CM

## 2022-10-05 DIAGNOSIS — R000 Tachycardia, unspecified: Secondary | ICD-10-CM

## 2022-10-05 NOTE — Patient Instructions (Signed)
Medication Instructions:  No changes *If you need a refill on your cardiac medications before your next appointment, please call your pharmacy*   Lab Work: None ordered If you have labs (blood work) drawn today and your tests are completely normal, you will receive your results only by: [place] (if you have MyChart) OR A paper copy in the mail If you have any lab test that is abnormal or we need to change your treatment, we will call you to review the results.   Testing/Procedures: None ordered   Follow-Up: At [facility], you and your health needs are our priority.  As part of our continuing mission to provide you with exceptional heart care, we have created designated Provider Care Teams.  These Care Teams include your primary Cardiologist (physician) and Advanced Practice Providers (APPs -  Physician Assistants and Nurse Practitioners) who all work together to provide you with the care you need, when you need it.  We recommend signing up for the patient portal called "MyChart".  Sign up information is provided on this After Visit Summary.  MyChart is used to connect with patients for Virtual Visits (Telemedicine).  Patients are able to view lab/test results, encounter notes, upcoming appointments, etc.  Non-urgent messages can be sent to your provider as well.   To learn more about what you can do with MyChart, go to [email or web address].    Your next appointment:   12 month(s)  Provider:   You may see Dr. Fletcher Anon or one of the following Advanced Practice Providers on your designated Care Team:   Murray Hodgkins, NP Christell Faith, PA-C Cadence Kathlen Mody, PA-C Gerrie Nordmann, NP    Other Instructions [place] Monitor Instructions  Your physician has requested you wear a ZIO patch monitor for 14 days.  This is a single patch monitor. [employer] supplies one patch monitor per enrollment. Additional stickers are not available. Please do not apply patch if you  will be having a Nuclear Stress Test,  Echocardiogram, Cardiac CT, MRI, or Chest Xray during the period you would be wearing the  monitor. The patch cannot be worn during these tests. You cannot remove and re-apply the  ZIO XT patch monitor.  Your ZIO patch monitor will be mailed 3 day USPS to your address on file. It may take 3-5 days  to receive your monitor after you have been enrolled.  Once you have received your monitor, please review the enclosed instructions. Your monitor  has already been registered assigning a specific monitor serial # to you.  Billing and Patient Assistance Program Information  We have supplied [employer] with any of your insurance information on file for billing purposes. [employer] offers a sliding scale Patient Assistance Program for patients that do not have  insurance, or whose insurance does not completely cover the cost of the ZIO monitor.  You must apply for the Patient Assistance Program to qualify for this discounted rate.  To apply, please call [employer] at [phone], select option 4, select option 2, ask to apply for  Patient Assistance Program. Theodore Demark will ask your household income, and how many people  are in your household. They will quote your out-of-pocket cost based on that information.  [employer] will also be able to set up a 49-month interest-free payment plan if needed.  Applying the monitor   Shave hair from upper left chest.  Hold abrader disc by orange tab. Rub abrader in 40 strokes over the upper left chest as  indicated in  your monitor instructions.  Clean area with 4 enclosed alcohol pads. Let dry.  Apply patch as indicated in monitor instructions. Patch will be placed under collarbone on left  side of chest with arrow pointing upward.  Rub patch adhesive wings for 2 minutes. Remove white label marked "1". Remove the white  label marked "2". Rub patch adhesive wings for 2 additional minutes.  While looking in a mirror, press and release  button in center of patch. A small green light will  flash 3-4 times. This will be your only indicator that the monitor has been turned on.  Do not shower for the first 24 hours. You may shower after the first 24 hours.  Press the button if you feel a symptom. You will hear a small click. Record Date, Time and  Symptom in the Patient Logbook.  When you are ready to remove the patch, follow instructions on the last 2 pages of Patient  Logbook. Stick patch monitor onto the last page of Patient Logbook.  Place Patient Logbook in the blue and white box. Use locking tab on box and tape box closed  securely. The blue and white box has prepaid postage on it. Please place it in the mailbox as  soon as possible. Your physician should have your test results approximately 7 days after the  monitor has been mailed back to [facility].  Call [place] at [phone] if you have questions regarding  your ZIO XT patch monitor. Call them immediately if you see an orange light blinking on your  monitor.  If your monitor falls off in less than 4 days, contact our Monitor department at [phone].  If your monitor becomes loose or falls off after 4 days call [employer] at [phone] for  suggestions on securing your monitor

## 2022-10-05 NOTE — Progress Notes (Signed)
Cardiology Office Note   Date:  10/05/2022   ID:  Osinachi, Crisafi Dec 21, 1950, MRN [id number]  PCP:  Crecencio Mc, MD  Cardiologist:   Kathlyn Sacramento, MD   Chief Complaint  Patient presents with   Other    SVT no complaints today. Meds reviewed verbally with pt.      History of Present Illness: MIYANI BATZEL is a 72 y.o. female who was referred by Dr. Derrel Nip for evaluation of paroxysmal supraventricular tachycardia. She has known history of hypothyroidism and fatty liver disease.  She has no prior cardiac history and overall is very active with no chest pain or shortness of breath.  She was attending a reunion recently and had some wine to drink.  Her smart watch alerted her to tachycardia with a heart rate of 126 bpm.  She was overall asymptomatic but the tachycardia continued for few hours.  She did not have any dizziness, syncope or presyncope.  She had recurrent episodes when she had alcohol again.  She cut down on alcohol intake and her symptoms improved.  She drinks 2 cups of coffee daily and she is a lifelong non-smoker.  Family history is negative for premature coronary artery disease.  Her mother did have atrial fibrillation.  She had recent labs done which were overall unremarkable.    Past Medical History:  Diagnosis Date   Basal cell carcinoma (BCC) of right upper arm 11/07/2019   Osteopenia    Thyroid disease     Past Surgical History:  Procedure Laterality Date   COLONOSCOPY     COLONOSCOPY WITH PROPOFOL N/A 01/09/2016   Procedure: COLONOSCOPY WITH PROPOFOL;  Surgeon: Lollie Sails, MD;  Location[facility] ENDOSCOPY;  Service: Endoscopy;  Laterality: N/A;   TUBAL LIGATION       Current Outpatient Medications  Medication Sig Dispense Refill   Calcium Carb-Cholecalciferol 1000-800 MG-UNIT TABS Take by mouth.     estradiol (ESTRACE) 0.5 MG tablet TAKE 1 TABLET BY MOUTH DAILY 90 tablet 1   levothyroxine (SYNTHROID) 50 MCG tablet TAKE 1 TABLET BY  MOUTH DAILY 90 tablet 1   methylPREDNISolone (MEDROL DOSEPAK) 4 MG TBPK tablet Take as directed. 21 each 0   Multiple Vitamin (MULTIVITAMIN) tablet Take 1 tablet by mouth daily.     Probiotic Product (PROBIOTIC FORMULA PO) Take by mouth.     progesterone (PROMETRIUM) 100 MG capsule TAKE 1 CAPSULE BY MOUTH DAILY 90 capsule 1   vitamin E 180 MG (400 UNITS) capsule      No current facility-administered medications for this visit.    Allergies:   Cortisone and Omeprazole    Social History:  The patient  reports that she quit smoking about 50 years ago. Her smoking use included cigarettes. She has never used smokeless tobacco. She reports current alcohol use of about 5.0 standard drinks of alcohol per week. She reports that she does not use drugs.   Family History:  The patient's family history includes Cancer in her father; Congestive Heart Failure in her mother; Hyperlipidemia in her mother; Hypertension in her brother and mother; Mental illness in her father and mother.    ROS:  Please see the history of present illness.   Otherwise, review of systems are positive for none.   All other systems are reviewed and negative.    PHYSICAL EXAM: VS:  BP 120/80 (BP Location: Right Arm, Patient Position: Sitting, Cuff Size: Normal)   Pulse 87   Ht '5\' 8"'$  (1.727  m)   Wt 188 lb 6 oz (85.4 kg)   SpO2 99%   BMI 28.64 kg/m  , BMI Body mass index is 28.64 kg/m. GEN: Well nourished, well developed, in no acute distress  HEENT: normal  Neck: no JVD, carotid bruits, or masses Cardiac: RRR; no murmurs, rubs, or gallops,no edema  Respiratory:  clear to auscultation bilaterally, normal work of breathing GI: soft, nontender, nondistended, + BS MS: no deformity or atrophy  Skin: warm and dry, no rash Neuro:  Strength and sensation are intact Psych: euthymic mood, full affect   EKG:  EKG is ordered today. The ekg ordered today demonstrates normal sinus rhythm with borderline LVH.  No significant ST or  T wave changes.   Recent Labs: 08/13/2022: ALT 15; BUN 19; Creatinine, Ser 0.94; Hemoglobin 14.2; Platelets 296.0; Potassium 3.8; Sodium 139; TSH 3.69    Lipid Panel    Component Value Date/Time   CHOL 175 11/09/2021 0918   TRIG 116.0 11/09/2021 0918   HDL 68.00 11/09/2021 0918   CHOLHDL 3 11/09/2021 0918   VLDL 23.2 11/09/2021 0918   LDLCALC 83 11/09/2021 0918      Wt Readings from Last 3 Encounters:  10/05/22 188 lb 6 oz (85.4 kg)  09/29/22 193 lb 6.4 oz (87.7 kg)  08/13/22 186 lb 12.8 oz (84.7 kg)          10/05/2022    1:57 PM  PAD Screen  Previous PAD dx? No  Previous surgical procedure? No  Pain with walking? No  Feet/toe relief with dangling? No  Painful, non-healing ulcers? No  Extremities discolored? No      ASSESSMENT AND PLAN:  1.  Intermittent tachycardia: Overall not very symptomatic but we have to rule out underlying atrial fibrillation.  I requested a 2 weeks ZIO monitor. If her symptoms persist, I asked her to consider upgrading her smart watch to EKG recording capacity.  2.  Minimal LVH on EKG: No history of hypertension and her cardiac exam is unremarkable.  If arrhythmia is detected, we will plan on obtaining an echocardiogram.    Disposition:   FU with me in 1 year  Signed,  Kathlyn Sacramento, MD  10/05/2022 2:08 PM    [place]

## 2022-10-07 ENCOUNTER — Telehealth: Payer: Self-pay | Admitting: Internal Medicine

## 2022-10-07 NOTE — Telephone Encounter (Signed)
Called patient to schedule Medicare Annual Wellness Visit (AWV). Left message for patient to call back and schedule Medicare Annual Wellness Visit (AWV).  Last date of AWV: 10/13/2021   Please schedule an appointment at any time with Denisa, [place].  If any questions, please contact me at [phone].    Thank you,  [place] Direct dial  [phone]

## 2022-10-08 ENCOUNTER — Telehealth: Payer: Self-pay | Admitting: Internal Medicine

## 2022-10-08 DIAGNOSIS — R002 Palpitations: Secondary | ICD-10-CM | POA: Diagnosis not present

## 2022-10-08 NOTE — Telephone Encounter (Signed)
[place] to schedule their annual wellness visit. Appointment made for 10/21/2022.  Thank you,  [place] Direct dial  [phone]

## 2022-10-21 ENCOUNTER — Ambulatory Visit (INDEPENDENT_AMBULATORY_CARE_PROVIDER_SITE_OTHER): Payer: Medicare PPO

## 2022-10-21 VITALS — Ht 68.0 in | Wt 188.0 lb

## 2022-10-21 DIAGNOSIS — Z0000 Encounter for general adult medical examination without abnormal findings: Secondary | ICD-10-CM

## 2022-10-21 NOTE — Progress Notes (Addendum)
Subjective:   Joanne Taylor is a 72 y.o. female who presents for Medicare Annual (Subsequent) preventive examination.  Review of Systems    No ROS.  Medicare Wellness Virtual Visit.  Visual/audio telehealth visit, UTA vital signs.   See social history for additional risk factors.   Cardiac Risk Factors include: advanced age (>33mn, >>5women)     Objective:    Today's Vitals   10/21/22 1136  Weight: 188 lb (85.3 kg)  Height: '5\' 8"'$  (1.727 m)   Body mass index is 28.59 kg/m.     10/21/2022   11:39 AM 10/13/2021   10:39 AM 10/10/2020   10:33 AM 10/10/2019   10:42 AM 10/09/2018   10:45 AM 07/18/2018   10:20 AM 09/16/2017    4:11 PM  Advanced Directives  Does Patient Have a Medical Advance Directive? Yes Yes Yes Yes Yes No No  Type of AParamedicof ACornishLiving will Joanne Taylor will Joanne Taylor will Joanne Taylor will Joanne Taylor will    Does patient want to make changes to medical advance directive? No - Patient declined No - Patient declined No - Patient declined No - Patient declined No - Patient declined    Copy of [place]? No - copy requested No - copy requested No - copy requested No - copy requested No - copy requested    Would patient like information on creating a medical advance directive?       No - Patient declined    Current Medications (verified) Outpatient Encounter Medications as of 10/21/2022  Medication Sig   Calcium Carb-Cholecalciferol 1000-800 MG-UNIT TABS Take by mouth.   estradiol (ESTRACE) 0.5 MG tablet TAKE 1 TABLET BY MOUTH DAILY   levothyroxine (SYNTHROID) 50 MCG tablet TAKE 1 TABLET BY MOUTH DAILY   methylPREDNISolone (MEDROL DOSEPAK) 4 MG TBPK tablet Take as directed.   Multiple Vitamin (MULTIVITAMIN) tablet Take 1 tablet by mouth daily.   Probiotic Product (PROBIOTIC FORMULA PO) Take by mouth.    progesterone (PROMETRIUM) 100 MG capsule TAKE 1 CAPSULE BY MOUTH DAILY   vitamin E 180 MG (400 UNITS) capsule    No facility-administered encounter medications on file as of 10/21/2022.    Allergies (verified) Cortisone and Omeprazole   History: Past Medical History:  Diagnosis Date   Basal cell carcinoma (BCC) of right upper arm 11/07/2019   Osteopenia    Thyroid disease    Past Surgical History:  Procedure Laterality Date   COLONOSCOPY     COLONOSCOPY WITH PROPOFOL N/A 01/09/2016   Procedure: COLONOSCOPY WITH PROPOFOL;  Surgeon: MLollie Sails MD;  [facility];  Service: Endoscopy;  Laterality: N/A;   TUBAL LIGATION     Family History  Problem Relation Age of Onset   Hyperlipidemia Mother    Hypertension Mother    Mental illness Mother        depressive disorder   Congestive Heart Failure Mother    Cancer Father        prostate   Mental illness Father        lewy body Dementia   Hypertension Brother    Breast cancer Neg Hx    Social History   Socioeconomic History   Marital status: Married    Spouse name: Not on file   Number of children: Not on file   Years of education: Not on file   Highest education level: Not on  file  Occupational History   Occupation: Asst Garment/textile technologist: acc    Comment: at [place] Use   Smoking status: Former    Types: Cigarettes    Quit date: 02/07/1972    Years since quitting: 50.7   Smokeless tobacco: Never  Substance and Sexual Activity   Alcohol use: Yes    Alcohol/week: 5.0 standard drinks of alcohol    Types: 5 Standard drinks or equivalent per week   Drug use: No   Sexual activity: Not on file  Other Topics Concern   Not on file  Social History Narrative   Not on file   Social Determinants of Health   Financial Resource Strain: Low Risk  (10/18/2022)   Overall Financial Resource Strain (CARDIA)    Difficulty of Paying Living Expenses: Not hard at all  Food Insecurity: No Food Insecurity  (10/18/2022)   Hunger Vital Sign    Worried About Running Out of Food in the Last Year: Never true    Ran Out of Food in the Last Year: Never true  Transportation Needs: No Transportation Needs (10/18/2022)   PRAPARE - Hydrologist (Medical): No    Lack of Transportation (Non-Medical): No  Physical Activity: Insufficiently Active (10/18/2022)   Exercise Vital Sign    Days of Exercise per Week: 2 days    Minutes of Exercise per Session: 20 min  Stress: No Stress Concern Present (10/18/2022)   [place]    Feeling of Stress : Not at all  Social Connections: Unknown (10/18/2022)   Social Connection and Isolation Panel [NHANES]    Frequency of Communication with Friends and Family: Not on file    Frequency of Social Gatherings with Friends and Family: More than three times a week    Attends Religious Services: Not on Advertising copywriter or Organizations: Yes    Attends Music therapist: More than 4 times per year    Marital Status: Married    Tobacco Counseling Counseling given: Not Answered   Clinical Intake:  Pre-visit preparation completed: Yes        Diabetes: No  How often do you need to have someone help you when you read instructions, pamphlets, or other written materials from your doctor or pharmacy?: 1 - Never    Interpreter Needed?: No      Activities of Daily Living    10/18/2022    8:36 AM  In your present state of health, do you have any difficulty performing the following activities:  Hearing? 0  Vision? 0  Difficulty concentrating or making decisions? 0  Walking or climbing stairs? 0  Dressing or bathing? 0  Doing errands, shopping? 0  Preparing Food and eating ? N  Using the Toilet? N  In the past six months, have you accidently leaked urine? Y  Comment Stress incontinence. Managed with daily liner.  Do you have problems with loss  of bowel control? N  Managing your Medications? N  Managing your Finances? N  Housekeeping or managing your Housekeeping? N    Patient Care Team: Crecencio Mc, MD as PCP - General (Internal Medicine) Wellington Hampshire, MD as PCP - Cardiology (Cardiology)  Indicate any recent Medical Services you may have received from other than [facility] providers in the past year (date may be approximate).     Assessment:   This is a routine wellness  examination for [facility].  Patient Medicare AWV questionnaire was completed by the patient on 10/18/22, I have confirmed that all information answered by patient is correct and no changes since this date.   I connected with  Lavenia Atlas on 10/21/22 by a audio enabled telemedicine application and verified that I am speaking with the correct person using two identifiers.  Patient Location: Home  Provider Location: Office/Clinic  I discussed the limitations of evaluation and management by telemedicine. The patient expressed understanding and agreed to proceed.   Hearing/Vision screen Hearing Screening - Comments::  Patient is able to hear conversational tones without difficulty. No issues reported. Vision Screening - Comments:: Followed by [facility] Wears corrective lenses  Annual visits    Dietary issues and exercise activities discussed: Current Exercise Habits: Home exercise routine, Type of exercise: walking, Time (Minutes): 20, Frequency (Times/Week): 2, Weekly Exercise (Minutes/Week): 40, Intensity: Mild   Goals Addressed               This Visit's Progress     Patient Stated     Weight goal 175lb (pt-stated)        Portion control meals. Reduce sugar intake. Walk for exercise.       Depression Screen    10/21/2022   11:39 AM 09/29/2022    8:22 AM 08/13/2022   12:57 PM 11/09/2021    8:35 AM 10/13/2021   10:43 AM 11/07/2020   10:48 AM 10/10/2020   10:35 AM  PHQ 2/9 Scores  PHQ - 2 Score 0 0 0 0 0 0 0  PHQ- 9 Score  0 0 1        Fall Risk    10/18/2022    8:36 AM 09/29/2022    8:22 AM 08/13/2022   12:57 PM 11/09/2021    8:35 AM 10/13/2021   10:45 AM  Fall Risk   Falls in the past year? 0 0 0 0 0  Number falls in past yr: 0 0 0  0  Injury with Fall? 0 0 0    Risk for fall due to :  No Fall Risks No Fall Risks No Fall Risks   Follow up Falls evaluation completed;Falls prevention discussed Falls evaluation completed Falls evaluation completed Falls evaluation completed Falls evaluation completed    FALL RISK PREVENTION PERTAINING TO THE HOME: Home free of loose throw rugs in walkways, pet beds, electrical cords, etc? Yes  Adequate lighting in your home to reduce risk of falls? Yes   ASSISTIVE DEVICES UTILIZED TO PREVENT FALLS: Life alert? No  Use of a cane, walker or w/c? No  Grab bars in the bathroom? Yes  Shower chair or bench in shower? No  Chair comfort height toilet? Yes   TIMED UP AND GO: Was the test performed? No .   Cognitive Function:    10/09/2018   10:55 AM 09/16/2017    4:35 PM  MMSE - Mini Mental State Exam  Orientation to time 5 5  Orientation to Place 5 5  Registration 3 3  Attention/ Calculation 5 5  Recall 3 3  Language- name 2 objects 2 2  Language- repeat 1 1  Language- follow 3 step command 3 3  Language- read & follow direction 1 1  Write a sentence 1 1  Copy design 1 1  Total score 30 30        10/21/2022   11:41 AM 10/10/2019   10:47 AM  6CIT Screen  What Year? 0 points  0 points  What month? 0 points 0 points  What time? 0 points 0 points  Count back from 20 0 points 0 points  Months in reverse 0 points 0 points  Repeat phrase 0 points 0 points  Total Score 0 points 0 points    Immunizations Immunization History  Administered Date(s) Administered   Influenza Split 05/23/2012   Influenza, High Dose Seasonal PF 05/24/2018   Influenza-Unspecified 06/24/2013, 05/20/2015, 05/16/2019, 05/07/2020, 05/18/2021, 04/30/2022   PFIZER(Purple Top)SARS-COV-2  Vaccination 09/18/2019, 10/09/2019, 05/26/2020, 01/09/2021   Pneumococcal Conjugate-13 05/28/2015   Pneumococcal Polysaccharide-23 11/16/2016   Rsv, Bivalent, Protein Subunit Rsvpref,pf Evans Lance) 04/30/2022   Tdap 02/07/2014   Zoster Recombinat (Shingrix) 09/15/2021, 11/07/2021   Zoster, Live 11/01/2013    Screening Tests Health Maintenance  Topic Date Due   COVID-19 Vaccine (5 - 2023-24 season) 11/06/2022 (Originally 04/09/2022)   MAMMOGRAM  04/21/2023   Medicare Annual Wellness (AWV)  10/21/2023   DTaP/Tdap/Td (2 - Td or Tdap) 02/08/2024   COLONOSCOPY (Pts 45-80yrs Insurance coverage will need to be confirmed)  01/08/2026   Pneumonia Vaccine 78+ Years old  Completed   INFLUENZA VACCINE  Completed   DEXA SCAN  Completed   Hepatitis C Screening  Completed   Zoster Vaccines- Shingrix  Completed   HPV VACCINES  Aged Out   Health Maintenance There are no preventive care reminders to display for this patient.  Lung Cancer Screening: (Low Dose CT Chest recommended if Age 4-80 years, 30 pack-year currently smoking OR have quit w/in 15years.) does not qualify.   Hepatitis C Screening: Completed 10/2013.   Vision Screening: Recommended annual ophthalmology exams for early detection of glaucoma and other disorders of the eye.   Dental Screening: Recommended annual dental exams for proper oral hygiene  Community Resource Referral / Chronic Care Management: CRR required this visit?  No   CCM required this visit?  No      Plan:     I have personally reviewed and noted the following in the patient's chart:   Medical and social history Use of alcohol, tobacco or illicit drugs  Current medications and supplements including opioid prescriptions. Patient is not currently taking opioid prescriptions. Functional ability and status Nutritional status Physical activity Advanced directives List of other physicians Hospitalizations, surgeries, and ER visits in previous 12  months Vitals Screenings to include cognitive, depression, and falls Referrals and appointments  In addition, I have reviewed and discussed with patient certain preventive protocols, quality metrics, and best practice recommendations. A written personalized care plan for preventive services as well as general preventive health recommendations were provided to patient.     [place], LPN   6/44/0347     I have reviewed the above information and agree with above.   Deborra Medina, MD

## 2022-10-21 NOTE — Patient Instructions (Addendum)
Joanne Taylor , Thank you for taking time to come for your Medicare Wellness Visit. I appreciate your ongoing commitment to your health goals. Please review the following plan we discussed and let me know if I can assist you in the future.   These are the goals we discussed:  Goals       Patient Stated     Weight goal 175lb (pt-stated)      Portion control meals Reduce sugar intake Walk for exercise         This is a list of the screening recommended for you and due dates:  Health Maintenance  Topic Date Due   COVID-19 Vaccine (5 - 2023-24 season) 11/06/2022*   Mammogram  04/21/2023   Medicare Annual Wellness Visit  10/21/2023   DTaP/Tdap/Td vaccine (2 - Td or Tdap) 02/08/2024   Colon Cancer Screening  01/08/2026   Pneumonia Vaccine  Completed   Flu Shot  Completed   DEXA scan (bone density measurement)  Completed   Hepatitis C Screening: USPSTF Recommendation to screen - Ages 15-79 yo.  Completed   Zoster (Shingles) Vaccine  Completed   HPV Vaccine  Aged Out  *Topic was postponed. The date shown is not the original due date.    Advanced directives: End of life planning; Advance aging; Advanced directives discussed.  Copy of current HCPOA/Living Will requested.    Conditions/risks identified: none new  Next appointment: Follow up in one year for your annual wellness visit    Preventive Care 65 Years and Older, Female Preventive care refers to lifestyle choices and visits with your health care provider that can promote health and wellness. What does preventive care include? A yearly physical exam. This is also called an annual well check. Dental exams once or twice a year. Routine eye exams. Ask your health care provider how often you should have your eyes checked. Personal lifestyle choices, including: Daily care of your teeth and gums. Regular physical activity. Eating a healthy diet. Avoiding tobacco and drug use. Limiting alcohol use. Practicing safe sex. Taking  low-dose aspirin every day. Taking vitamin and mineral supplements as recommended by your health care provider. What happens during an annual well check? The services and screenings done by your health care provider during your annual well check will depend on your age, overall health, lifestyle risk factors, and family history of disease. Counseling  Your health care provider may ask you questions about your: Alcohol use. Tobacco use. Drug use. Emotional well-being. Home and relationship well-being. Sexual activity. Eating habits. History of falls. Memory and ability to understand (cognition). Work and work Statistician. Reproductive health. Screening  You may have the following tests or measurements: Height, weight, and BMI. Blood pressure. Lipid and cholesterol levels. These may be checked every 5 years, or more frequently if you are over 76 years old. Skin check. Lung cancer screening. You may have this screening every year starting at age 36 if you have a 30-pack-year history of smoking and currently smoke or have quit within the past 15 years. Fecal occult blood test (FOBT) of the stool. You may have this test every year starting at age 38. Flexible sigmoidoscopy or colonoscopy. You may have a sigmoidoscopy every 5 years or a colonoscopy every 10 years starting at age 22. Hepatitis C blood test. Hepatitis B blood test. Sexually transmitted disease (STD) testing. Diabetes screening. This is done by checking your blood sugar (glucose) after you have not eaten for a while (fasting). You may have this  done every 1-3 years. Bone density scan. This is done to screen for osteoporosis. You may have this done starting at age 36. Mammogram. This may be done every 1-2 years. Talk to your health care provider about how often you should have regular mammograms. Talk with your health care provider about your test results, treatment options, and if necessary, the need for more tests. Vaccines   Your health care provider may recommend certain vaccines, such as: Influenza vaccine. This is recommended every year. Tetanus, diphtheria, and acellular pertussis (Tdap, Td) vaccine. You may need a Td booster every 10 years. Zoster vaccine. You may need this after age 49. Pneumococcal 13-valent conjugate (PCV13) vaccine. One dose is recommended after age 63. Pneumococcal polysaccharide (PPSV23) vaccine. One dose is recommended after age 35. Talk to your health care provider about which screenings and vaccines you need and how often you need them. This information is not intended to replace advice given to you by your health care provider. Make sure you discuss any questions you have with your health care provider. Document Released: 08/22/2015 Document Revised: 04/14/2016 Document Reviewed: 05/27/2015 Elsevier Interactive Patient Education  2017 [place] Prevention in the Home Falls can cause injuries. They can happen to people of all ages. There are many things you can do to make your home safe and to help prevent falls. What can I do on the outside of my home? Regularly fix the edges of walkways and driveways and fix any cracks. Remove anything that might make you trip as you walk through a door, such as a raised step or threshold. Trim any bushes or trees on the path to your home. Use bright outdoor lighting. Clear any walking paths of anything that might make someone trip, such as rocks or tools. Regularly check to see if handrails are loose or broken. Make sure that both sides of any steps have handrails. Any raised decks and porches should have guardrails on the edges. Have any leaves, snow, or ice cleared regularly. Use sand or salt on walking paths during winter. Clean up any spills in your garage right away. This includes oil or grease spills. What can I do in the bathroom? Use night lights. Install grab bars by the toilet and in the tub and shower. Do not use towel  bars as grab bars. Use non-skid mats or decals in the tub or shower. If you need to sit down in the shower, use a plastic, non-slip stool. Keep the floor dry. Clean up any water that spills on the floor as soon as it happens. Remove soap buildup in the tub or shower regularly. Attach bath mats securely with double-sided non-slip rug tape. Do not have throw rugs and other things on the floor that can make you trip. What can I do in the bedroom? Use night lights. Make sure that you have a light by your bed that is easy to reach. Do not use any sheets or blankets that are too big for your bed. They should not hang down onto the floor. Have a firm chair that has side arms. You can use this for support while you get dressed. Do not have throw rugs and other things on the floor that can make you trip. What can I do in the kitchen? Clean up any spills right away. Avoid walking on wet floors. Keep items that you use a lot in easy-to-reach places. If you need to reach something above you, use a strong step stool that  has a grab bar. Keep electrical cords out of the way. Do not use floor polish or wax that makes floors slippery. If you must use wax, use non-skid floor wax. Do not have throw rugs and other things on the floor that can make you trip. What can I do with my stairs? Do not leave any items on the stairs. Make sure that there are handrails on both sides of the stairs and use them. Fix handrails that are broken or loose. Make sure that handrails are as long as the stairways. Check any carpeting to make sure that it is firmly attached to the stairs. Fix any carpet that is loose or worn. Avoid having throw rugs at the top or bottom of the stairs. If you do have throw rugs, attach them to the floor with carpet tape. Make sure that you have a light switch at the top of the stairs and the bottom of the stairs. If you do not have them, ask someone to add them for you. What else can I do to help  prevent falls? Wear shoes that: Do not have high heels. Have rubber bottoms. Are comfortable and fit you well. Are closed at the toe. Do not wear sandals. If you use a stepladder: Make sure that it is fully opened. Do not climb a closed stepladder. Make sure that both sides of the stepladder are locked into place. Ask someone to hold it for you, if possible. Clearly mark and make sure that you can see: Any grab bars or handrails. First and last steps. Where the edge of each step is. Use tools that help you move around (mobility aids) if they are needed. These include: Canes. Walkers. Scooters. Crutches. Turn on the lights when you go into a dark area. Replace any light bulbs as soon as they burn out. Set up your furniture so you have a clear path. Avoid moving your furniture around. If any of your floors are uneven, fix them. If there are any pets around you, be aware of where they are. Review your medicines with your doctor. Some medicines can make you feel dizzy. This can increase your chance of falling. Ask your doctor what other things that you can do to help prevent falls. This information is not intended to replace advice given to you by your health care provider. Make sure you discuss any questions you have with your health care provider. Document Released: 05/22/2009 Document Revised: 01/01/2016 Document Reviewed: 08/30/2014 Elsevier Interactive Patient Education  2017 [employer].

## 2022-10-28 DIAGNOSIS — R002 Palpitations: Secondary | ICD-10-CM | POA: Diagnosis not present

## 2022-11-02 ENCOUNTER — Encounter: Payer: Self-pay | Admitting: Cardiovascular Disease

## 2022-11-11 ENCOUNTER — Encounter: Payer: Self-pay | Admitting: Internal Medicine

## 2022-11-11 ENCOUNTER — Ambulatory Visit (INDEPENDENT_AMBULATORY_CARE_PROVIDER_SITE_OTHER): Payer: Medicare PPO | Admitting: Internal Medicine

## 2022-11-11 VITALS — BP 122/78 | HR 70 | Temp 98.7°F | Ht 68.0 in | Wt 190.4 lb

## 2022-11-11 DIAGNOSIS — I471 Supraventricular tachycardia, unspecified: Secondary | ICD-10-CM | POA: Diagnosis not present

## 2022-11-11 DIAGNOSIS — K760 Fatty (change of) liver, not elsewhere classified: Secondary | ICD-10-CM

## 2022-11-11 DIAGNOSIS — J069 Acute upper respiratory infection, unspecified: Secondary | ICD-10-CM

## 2022-11-11 DIAGNOSIS — Z1231 Encounter for screening mammogram for malignant neoplasm of breast: Secondary | ICD-10-CM

## 2022-11-11 DIAGNOSIS — E785 Hyperlipidemia, unspecified: Secondary | ICD-10-CM

## 2022-11-11 DIAGNOSIS — E039 Hypothyroidism, unspecified: Secondary | ICD-10-CM | POA: Diagnosis not present

## 2022-11-11 DIAGNOSIS — Z0001 Encounter for general adult medical examination with abnormal findings: Secondary | ICD-10-CM

## 2022-11-11 DIAGNOSIS — Z0000 Encounter for general adult medical examination without abnormal findings: Secondary | ICD-10-CM

## 2022-11-11 LAB — LIPID PANEL
Cholesterol: 188 mg/dL (ref 0–200)
HDL: 68 mg/dL (ref >39.00)
LDL Cholesterol: 100 mg/dL — ABNORMAL HIGH (ref 0–99)
NonHDL: 119.87
Total CHOL/HDL Ratio: 3
Triglycerides: 101 mg/dL (ref 0.0–149.0)
VLDL: 20.2 mg/dL (ref 0.0–40.0)

## 2022-11-11 LAB — HEMOGLOBIN A1C: Hgb A1c MFr Bld: 5.6 % (ref 4.6–6.5)

## 2022-11-11 LAB — LDL CHOLESTEROL, DIRECT: Direct LDL: 99 mg/dL

## 2022-11-11 MED ORDER — PROPRANOLOL HCL 10 MG PO TABS: 10.0000 mg | ORAL_TABLET | Freq: Three times a day (TID) | ORAL | 0 refills | Status: DC

## 2022-11-11 NOTE — Progress Notes (Signed)
Joanne ID: Joanne Taylor, female    DOB: 16-Sep-1950  Age: 72 y.o. MRN: [id number]  The Joanne is here for annual PREVENTIVE examination and management of other chronic and acute problems.   The risk factors are reflected in the social history.  The roster of all physicians providing medical care to Joanne - is listed in the Snapshot section of the chart.  Activities of daily living:  The Joanne is 100% independent in all ADLs: dressing, toileting, feeding as well as independent mobility  Home safety : The Joanne has smoke detectors in the home. They wear seatbelts.  There are no firearms at home. There is no violence in the home.   There is no risks for hepatitis, STDs or HIV. There is no   history of blood transfusion. They have no travel history to infectious disease endemic areas of the world.  The Joanne has seen their dentist in the last six month. They have seen their eye doctor in the last year. They admit to slight hearing difficulty with regard to whispered voices and some television programs.  They have deferred audiologic testing in the last year.  They do not  have excessive sun exposure. Discussed the need for sun protection: hats, long sleeves and use of sunscreen if there is significant sun exposure.   Diet: the importance of a healthy diet is discussed. They do have a healthy diet.  The benefits of regular aerobic exercise were discussed. She walks 4 times per week ,  20 minutes.   Depression screen: there are no signs or vegative symptoms of depression- irritability, change in appetite, anhedonia, sadness/tearfullness.  Cognitive assessment: the Joanne manages all their financial and personal affairs and is actively engaged. They could relate day,date,year and events; recalled 2/3 objects at 3 minutes; performed clock-face test normally.  The following portions of the Joanne's history were reviewed and updated as appropriate: allergies, current medications, past  family history, past medical history,  past surgical history, past social history  and problem list.  Visual acuity was not assessed per Joanne preference since she has regular follow up with her ophthalmologist. Hearing and body mass index were assessed and reviewed.   During the course of the visit the Joanne was educated and counseled about appropriate screening and preventive services including : fall prevention , diabetes screening, nutrition counseling, colorectal cancer screening, and recommended immunizations.    CC: The primary encounter diagnosis was Encounter for screening mammogram for malignant neoplasm of breast. Diagnoses of Encounter for preventative adult health care examination, Hyperlipidemia, unspecified hyperlipidemia type, Paroxysmal supraventricular tachycardia, Viral upper respiratory tract infection, Fatty liver disease, nonalcoholic, Acquired hypothyroidism, and Encounter for preventive health examination were also pertinent to this visit.  SVT: reviewed ZIO monitor.   Snoring at times  waking up at night .defers sleep study  Fatty liver.  Found incidentally on ultrasound in 2022. has not had Hep A/B vaccines yet   no symptoms.     History Joanne Taylor has a past medical history of Basal cell carcinoma (BCC) of right upper arm (11/07/2019), Osteopenia, and Thyroid disease.   She has a past surgical history that includes Tubal ligation; Colonoscopy; and Colonoscopy with propofol (N/A, 01/09/2016).   Her family history includes Cancer in her father; Congestive Heart Failure in her mother; Hyperlipidemia in her mother; Hypertension in her brother and mother; Mental illness in her father and mother.She reports that she quit smoking about 50 years ago. Her smoking use included cigarettes. She has never used  smokeless tobacco. She reports current alcohol use of about 5.0 standard drinks of alcohol per week. She reports that she does not use drugs.  Outpatient Medications Prior  to Visit  Medication Sig Dispense Refill   Calcium Carb-Cholecalciferol 1000-800 MG-UNIT TABS Take by mouth.     estradiol (ESTRACE) 0.5 MG tablet TAKE 1 TABLET BY MOUTH DAILY 90 tablet 1   levothyroxine (SYNTHROID) 50 MCG tablet TAKE 1 TABLET BY MOUTH DAILY 90 tablet 1   methylPREDNISolone (MEDROL DOSEPAK) 4 MG TBPK tablet Take as directed. 21 each 0   Multiple Vitamin (MULTIVITAMIN) tablet Take 1 tablet by mouth daily.     Probiotic Product (PROBIOTIC FORMULA PO) Take by mouth.     progesterone (PROMETRIUM) 100 MG capsule TAKE 1 CAPSULE BY MOUTH DAILY 90 capsule 1   vitamin E 180 MG (400 UNITS) capsule      No facility-administered medications prior to visit.    Review of Systems  Joanne denies headache, fevers, malaise, unintentional weight loss, skin rash, eye pain, sinus congestion and sinus pain, sore throat, dysphagia,  hemoptysis , cough, dyspnea, wheezing, chest pain, palpitations, orthopnea, edema, abdominal pain, nausea, melena, diarrhea, constipation, flank pain, dysuria, hematuria, urinary  Frequency, nocturia, numbness, tingling, seizures,  Focal weakness, Loss of consciousness,  Tremor, insomnia, depression, anxiety, and suicidal ideation.     Objective:  BP 122/78   Pulse 70   Temp 98.7 F (37.1 C) (Oral)   Ht 5\' 8"  (1.727 m)   Wt 190 lb 6.4 oz (86.4 kg)   SpO2 98%   BMI 28.95 kg/m   Physical Exam Vitals reviewed.  Constitutional:      General: She is not in acute distress.    Appearance: Normal appearance. She is well-developed and normal weight. She is not ill-appearing, toxic-appearing or diaphoretic.  HENT:     Head: Normocephalic.     Right Ear: Tympanic membrane, ear canal and external ear normal. There is no impacted cerumen.     Left Ear: Tympanic membrane, ear canal and external ear normal. There is no impacted cerumen.     Nose: Nose normal.     Mouth/Throat:     Mouth: Mucous membranes are moist.     Pharynx: Oropharynx is clear.  Eyes:      General: No scleral icterus.       Right eye: No discharge.        Left eye: No discharge.     Conjunctiva/sclera: Conjunctivae normal.     Pupils: Pupils are equal, round, and reactive to light.  Neck:     Thyroid: No thyromegaly.     Vascular: No carotid bruit or JVD.  Cardiovascular:     Rate and Rhythm: Normal rate and regular rhythm.     Heart sounds: Normal heart sounds.  Pulmonary:     Effort: Pulmonary effort is normal. No respiratory distress.     Breath sounds: Normal breath sounds.  Chest:  Breasts:    Breasts are symmetrical.     Right: Normal. No swelling, inverted nipple, mass, nipple discharge, skin change or tenderness.     Left: Normal. No swelling, inverted nipple, mass, nipple discharge, skin change or tenderness.  Abdominal:     General: Bowel sounds are normal.     Palpations: Abdomen is soft. There is no mass.     Tenderness: There is no abdominal tenderness. There is no guarding or rebound.  Musculoskeletal:        General: Normal range of motion.  Cervical back: Normal range of motion and neck supple.  Lymphadenopathy:     Cervical: No cervical adenopathy.     Upper Body:     Right upper body: No supraclavicular, axillary or pectoral adenopathy.     Left upper body: No supraclavicular, axillary or pectoral adenopathy.  Skin:    General: Skin is warm and dry.  Neurological:     General: No focal deficit present.     Mental Status: She is alert and oriented to person, place, and time. Mental status is at baseline.  Psychiatric:        Mood and Affect: Mood normal.        Behavior: Behavior normal.        Thought Content: Thought content normal.        Judgment: Judgment normal.    Assessment & Plan:  Encounter for screening mammogram for malignant neoplasm of breast -     3D Screening Mammogram, Left and Right; Future  Encounter for preventative adult health care examination  Hyperlipidemia, unspecified hyperlipidemia type -     Lipid  panel -     LDL cholesterol, direct -     Hemoglobin A1c  Paroxysmal supraventricular tachycardia Assessment & Plan: ZIO monitor reviewed.  Rare SVT.  Rx for propranolol written for prn use    Viral upper respiratory tract infection Assessment & Plan: Resolved with supportive care after not resolving with antibiotics. Resolved with prednisone   Fatty liver disease, nonalcoholic Assessment & Plan: She has reduced the fat and refined sugars in her diet and limits  ETOH intake to one drink daily .  Ultrasound ordered   Orders: -     US ABDOMEN LIMITED RUQ (LIVER/GB); Future  Acquired hypothyroidism Assessment & Plan: Thyroid function is WNL on current dose of 50 mcg daily. .  .  Given her history of SVT will not attempt to reach TSH of 1.0  Lab Results  Component Value Date   TSH 3.69 08/13/2022      Encounter for preventive health examination Assessment & Plan: age appropriate education and counseling updated, referrals for preventative services and immunizations addressed, dietary and smoking counseling addressed, most recent labs reviewed.  I have personally reviewed and have noted:   1) the Joanne's medical and social history 2) The pt's use of alcohol, tobacco, and illicit drugs 3) The Joanne's current medications and supplements 4) Functional ability including ADL's, fall risk, home safety risk, hearing and visual impairment 5) Diet and physical activities 6) Evidence for depression or mood disorder 7) The Joanne's height, weight, and BMI have been recorded in the chart     I have made referrals, and provided counseling and education based on review of the above    Other orders -     Propranolol HCl; Take 1 tablet (10 mg total) by mouth 3 (three) times daily. As needed for rapid heart rate  Dispense: 30 tablet; Refill: 0       Follow-up: Return in about 6 months (around 05/13/2023).   Sherlene Shams, MD

## 2022-11-11 NOTE — Assessment & Plan Note (Addendum)
ZIO monitor reviewed.  Rare SVT.  Rx for propranolol written for prn use

## 2022-11-11 NOTE — Assessment & Plan Note (Signed)
Resolved with supportive care after not resolving with antibiotics. Resolved with prednisone

## 2022-11-11 NOTE — Patient Instructions (Signed)
I have prescribed generic Inderal to use if you have a prolonged episode of rapid heart rate.  It is NOT SEDATING.  IT just slows your heart rate down ,  will last for about 6-8 hours   Your annual mammogram has been ordered AND IS DUE in  September , [facility] will not allow [place] to schedule it for you,  so please  call to make your appointment [phone]    I have ordered an ultrasound of your liver to follow up on fatty liver

## 2022-11-12 ENCOUNTER — Encounter: Payer: Self-pay | Admitting: Internal Medicine

## 2022-11-14 ENCOUNTER — Other Ambulatory Visit: Payer: Self-pay | Admitting: Internal Medicine

## 2022-11-14 DIAGNOSIS — E039 Hypothyroidism, unspecified: Secondary | ICD-10-CM

## 2022-11-14 MED ORDER — LEVOTHYROXINE SODIUM 50 MCG PO TABS: ORAL_TABLET | ORAL | 1 refills | Status: DC

## 2022-11-14 NOTE — Assessment & Plan Note (Signed)

## 2022-11-14 NOTE — Assessment & Plan Note (Signed)
She has reduced the fat and refined sugars in her diet and limits  ETOH intake to one drink daily .  Ultrasound ordered

## 2022-11-14 NOTE — Assessment & Plan Note (Signed)
Thyroid function is WNL on current dose of 50 mcg daily. .  .  Given her history of SVT will not attempt to reach TSH of 1.0  Lab Results  Component Value Date   TSH 3.69 08/13/2022

## 2022-11-15 ENCOUNTER — Other Ambulatory Visit: Payer: Self-pay | Admitting: Internal Medicine

## 2022-11-15 NOTE — Telephone Encounter (Signed)
[facility] called in staying that they need more directions on med levothyroxine (SYNTHROID) 50 MCG tablet. Any questions, they available [phone].

## 2022-11-16 NOTE — Telephone Encounter (Signed)
The pharmacy is needing clarification on directions

## 2022-11-17 ENCOUNTER — Other Ambulatory Visit: Payer: Self-pay | Admitting: Internal Medicine

## 2022-11-17 MED ORDER — LEVOTHYROXINE SODIUM 50 MCG PO TABS: ORAL_TABLET | ORAL | 1 refills | Status: DC

## 2022-11-18 ENCOUNTER — Other Ambulatory Visit: Payer: Self-pay | Admitting: Internal Medicine

## 2022-11-24 ENCOUNTER — Ambulatory Visit
Admission: RE | Admit: 2022-11-24 | Discharge: 2022-11-24 | Disposition: A | Discharge status: Home / Self Care | Payer: Medicare PPO | Source: Ambulatory Visit | Attending: Internal Medicine | Admitting: Internal Medicine

## 2022-11-24 DIAGNOSIS — K760 Fatty (change of) liver, not elsewhere classified: Secondary | ICD-10-CM | POA: Insufficient documentation

## 2022-11-26 ENCOUNTER — Other Ambulatory Visit: Payer: Self-pay | Admitting: Internal Medicine

## 2022-11-26 DIAGNOSIS — H02052 Trichiasis without entropian right lower eyelid: Secondary | ICD-10-CM | POA: Diagnosis not present

## 2022-11-26 DIAGNOSIS — H1013 Acute atopic conjunctivitis, bilateral: Secondary | ICD-10-CM | POA: Diagnosis not present

## 2022-12-21 DIAGNOSIS — H02052 Trichiasis without entropian right lower eyelid: Secondary | ICD-10-CM | POA: Diagnosis not present

## 2022-12-21 DIAGNOSIS — H2513 Age-related nuclear cataract, bilateral: Secondary | ICD-10-CM | POA: Diagnosis not present

## 2022-12-21 DIAGNOSIS — H16223 Keratoconjunctivitis sicca, not specified as Sjogren's, bilateral: Secondary | ICD-10-CM | POA: Diagnosis not present

## 2023-01-17 DIAGNOSIS — R208 Other disturbances of skin sensation: Secondary | ICD-10-CM | POA: Diagnosis not present

## 2023-01-17 DIAGNOSIS — L538 Other specified erythematous conditions: Secondary | ICD-10-CM | POA: Diagnosis not present

## 2023-01-17 DIAGNOSIS — L820 Inflamed seborrheic keratosis: Secondary | ICD-10-CM | POA: Diagnosis not present

## 2023-01-17 DIAGNOSIS — Z85828 Personal history of other malignant neoplasm of skin: Secondary | ICD-10-CM | POA: Diagnosis not present

## 2023-01-17 DIAGNOSIS — D2261 Melanocytic nevi of right upper limb, including shoulder: Secondary | ICD-10-CM | POA: Diagnosis not present

## 2023-01-17 DIAGNOSIS — D2272 Melanocytic nevi of left lower limb, including hip: Secondary | ICD-10-CM | POA: Diagnosis not present

## 2023-01-17 DIAGNOSIS — D2262 Melanocytic nevi of left upper limb, including shoulder: Secondary | ICD-10-CM | POA: Diagnosis not present

## 2023-01-17 DIAGNOSIS — D225 Melanocytic nevi of trunk: Secondary | ICD-10-CM | POA: Diagnosis not present

## 2023-01-17 DIAGNOSIS — L821 Other seborrheic keratosis: Secondary | ICD-10-CM | POA: Diagnosis not present

## 2023-03-05 ENCOUNTER — Other Ambulatory Visit: Payer: Self-pay | Admitting: Internal Medicine

## 2023-04-21 IMAGING — MG MM DIGITAL SCREENING BILAT W/ TOMO AND CAD
8 series · 8 of 24 positions shown · non-contrast
Comparison: Previous exam(s).

CLINICAL DATA: Screening.

EXAM:
DIGITAL SCREENING BILATERAL MAMMOGRAM WITH TOMOSYNTHESIS AND CAD
TECHNIQUE: Bilateral screening digital craniocaudal and mediolateral oblique
mammograms were obtained. Bilateral screening digital breast
tomosynthesis was performed. The images were evaluated with
computer-aided detection.

[L MLO synth-2D]
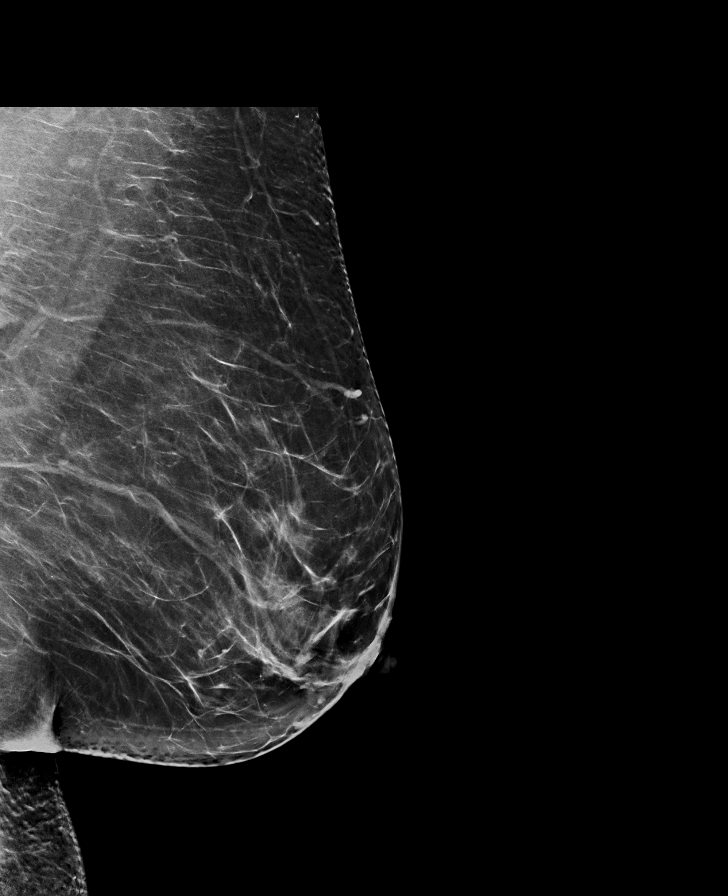

[L CC synth-2D]
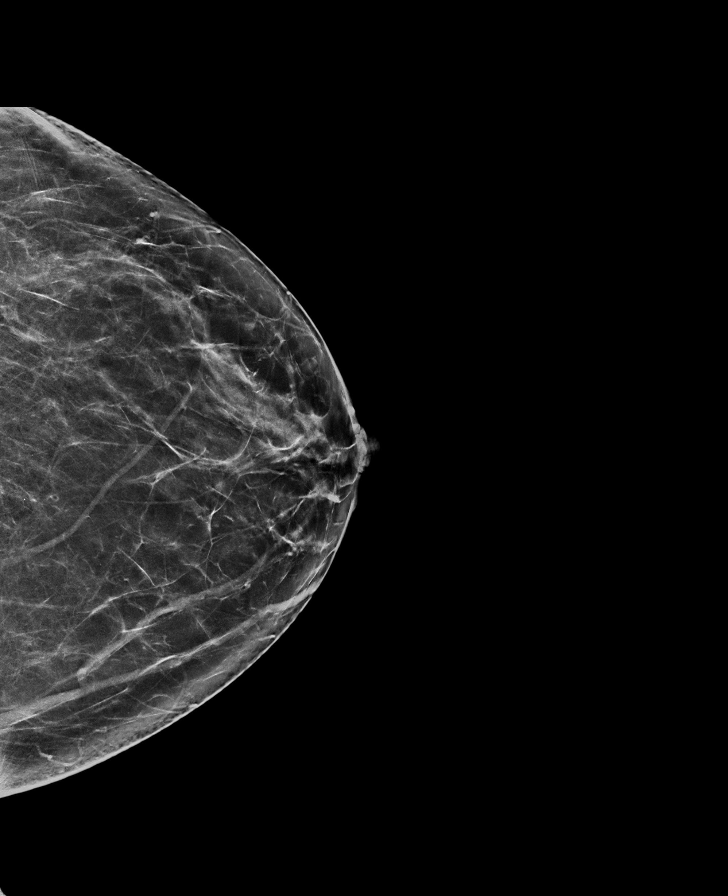

[R MLO synth-2D]
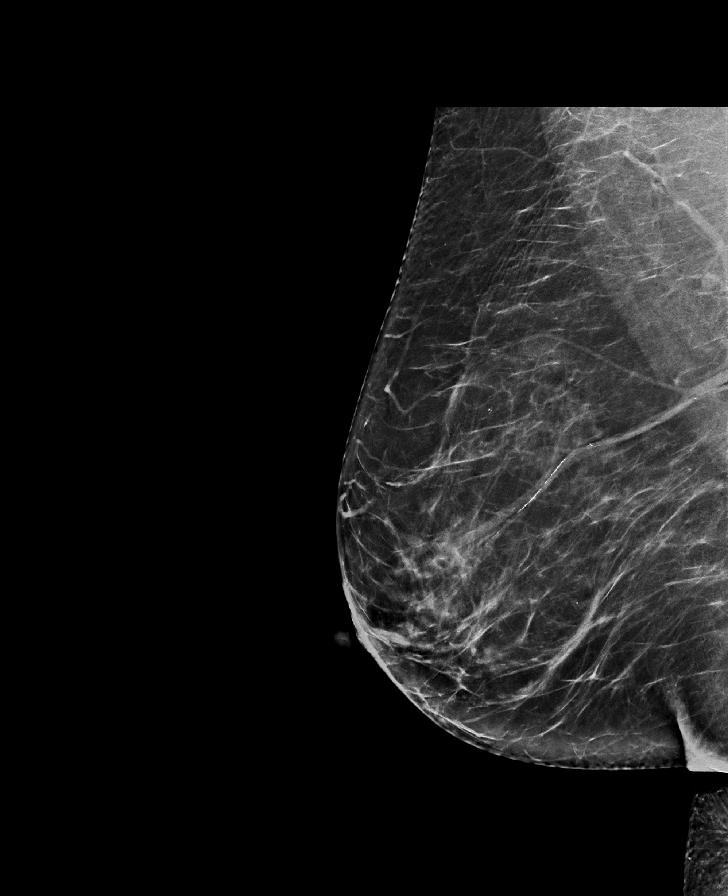

[R CC synth-2D]
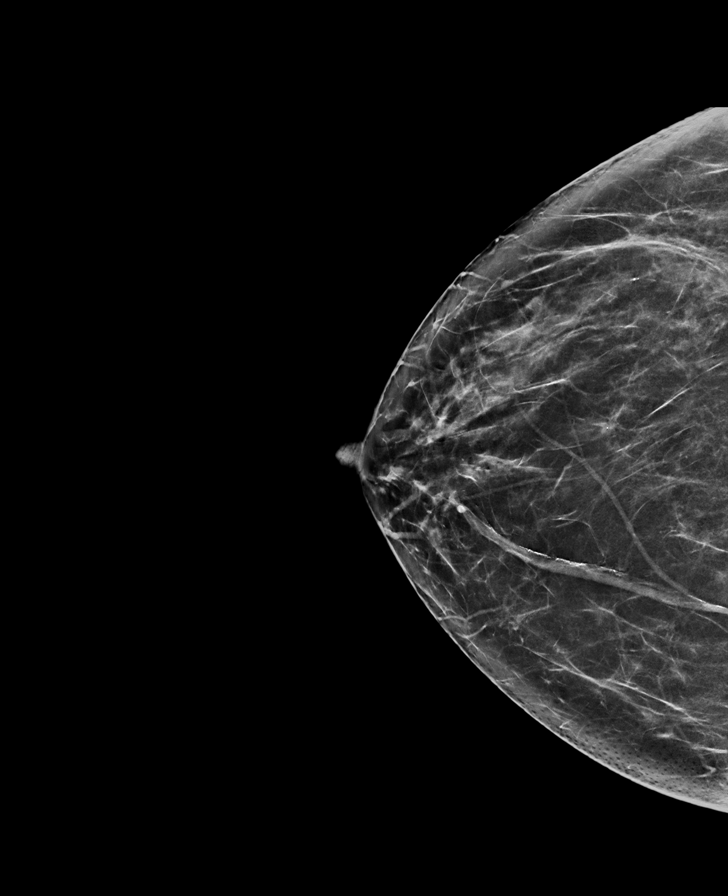

[L MLO tomo · tomo slice 43/85.0]
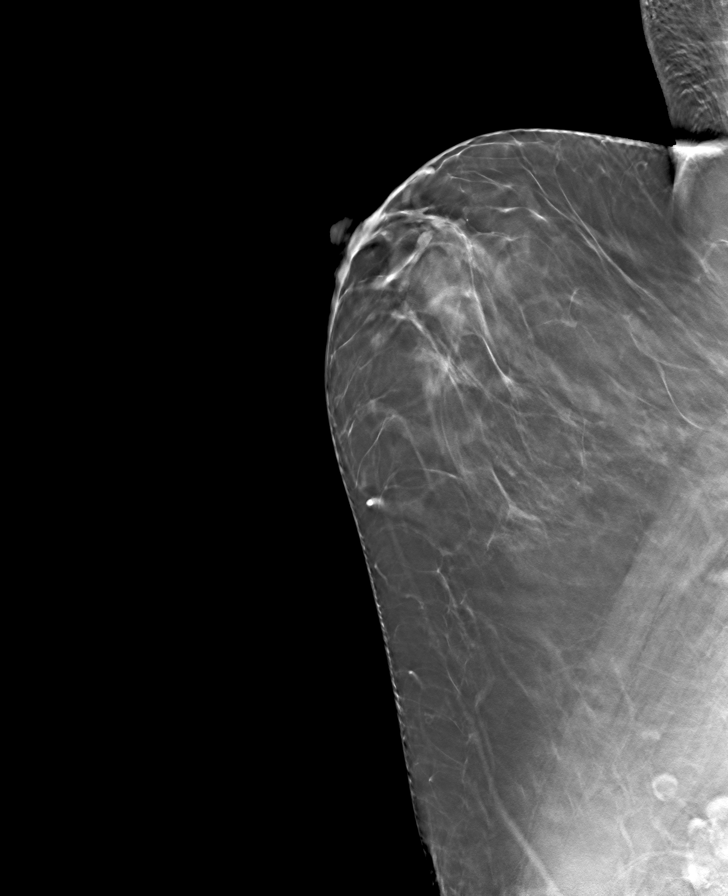

[R MLO tomo · tomo slice 41/80.0]
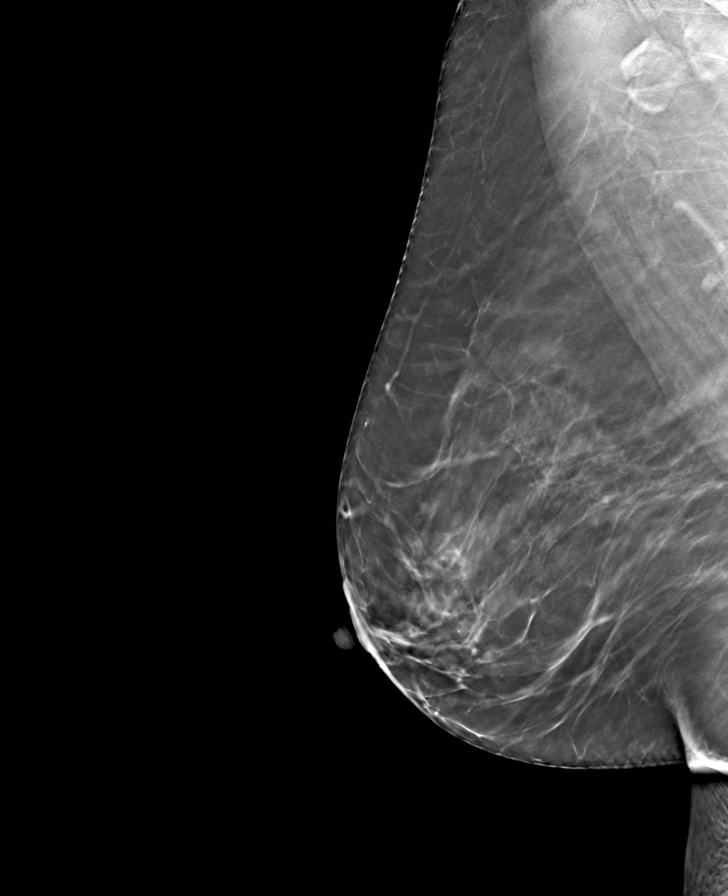

[L CC tomo · tomo slice 37/73.0]
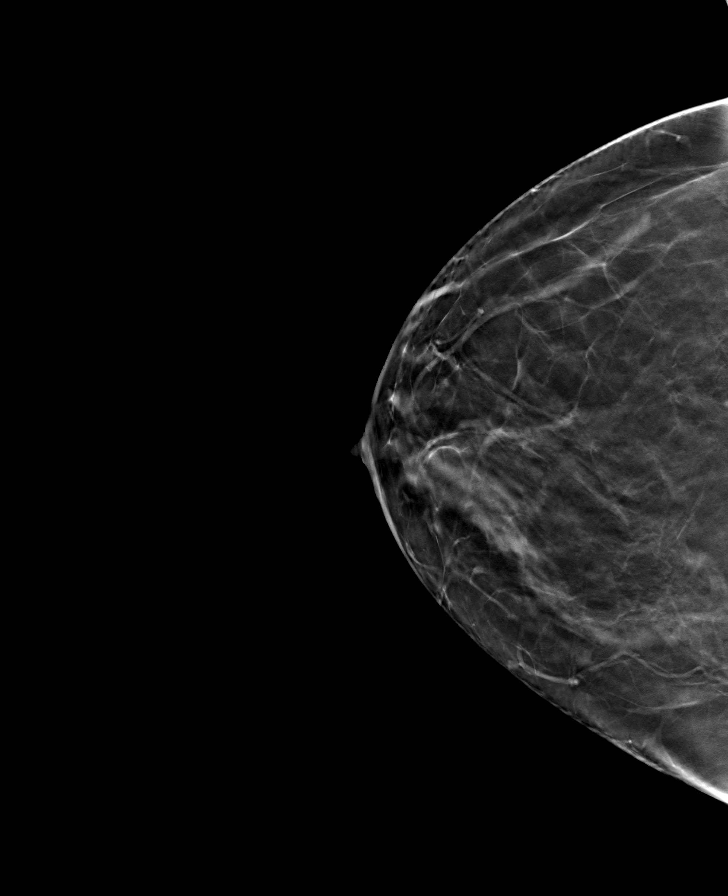

[R CC tomo · tomo slice 35/69.0]
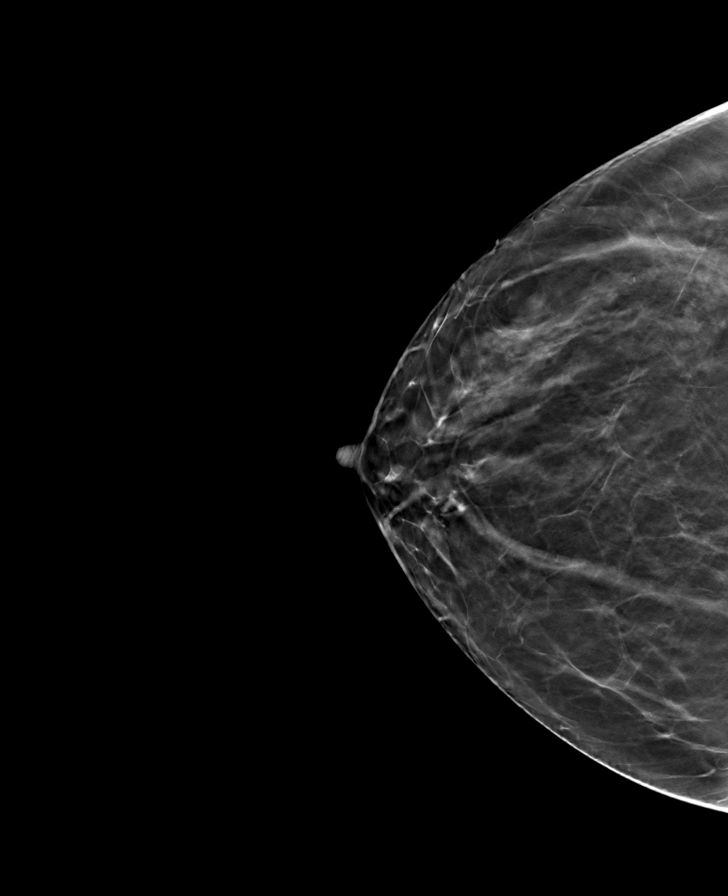

[8 of 24 positions shown; findings below may reference images not displayed]

ACR Breast Density Category b: There are scattered areas of
fibroglandular density.
FINDINGS: There are no findings suspicious for malignancy.
IMPRESSION: No mammographic evidence of malignancy. A result letter of this
screening mammogram will be mailed directly to the patient.

RECOMMENDATION:
Screening mammogram in one year. (Code:51-O-LD2)

BI-RADS CATEGORY  1: Negative.

## 2023-04-26 ENCOUNTER — Ambulatory Visit
Admission: RE | Admit: 2023-04-26 | Discharge: 2023-04-26 | Disposition: A | Discharge status: Home / Self Care | Payer: Medicare PPO | Source: Ambulatory Visit | Attending: Internal Medicine | Admitting: Internal Medicine

## 2023-04-26 DIAGNOSIS — Z1231 Encounter for screening mammogram for malignant neoplasm of breast: Secondary | ICD-10-CM | POA: Insufficient documentation

## 2023-05-03 ENCOUNTER — Other Ambulatory Visit: Payer: Self-pay | Admitting: Internal Medicine

## 2023-05-13 ENCOUNTER — Ambulatory Visit: Payer: Medicare PPO | Admitting: Internal Medicine

## 2023-06-02 ENCOUNTER — Other Ambulatory Visit: Payer: Self-pay | Admitting: Internal Medicine

## 2023-06-10 ENCOUNTER — Ambulatory Visit: Payer: Medicare PPO | Admitting: Internal Medicine

## 2023-06-13 ENCOUNTER — Encounter: Payer: Self-pay | Admitting: Internal Medicine

## 2023-06-13 ENCOUNTER — Ambulatory Visit: Payer: Medicare PPO | Admitting: Internal Medicine

## 2023-06-13 VITALS — BP 122/64 | HR 70 | Ht 68.0 in | Wt 174.6 lb

## 2023-06-13 DIAGNOSIS — E039 Hypothyroidism, unspecified: Secondary | ICD-10-CM

## 2023-06-13 DIAGNOSIS — I471 Supraventricular tachycardia, unspecified: Secondary | ICD-10-CM

## 2023-06-13 DIAGNOSIS — R944 Abnormal results of kidney function studies: Secondary | ICD-10-CM | POA: Diagnosis not present

## 2023-06-13 DIAGNOSIS — E663 Overweight: Secondary | ICD-10-CM | POA: Diagnosis not present

## 2023-06-13 DIAGNOSIS — K760 Fatty (change of) liver, not elsewhere classified: Secondary | ICD-10-CM

## 2023-06-13 LAB — COMPREHENSIVE METABOLIC PANEL
ALT: 12 U/L (ref 0–35)
AST: 17 U/L (ref 0–37)
Albumin: 4.2 g/dL (ref 3.5–5.2)
Alkaline Phosphatase: 68 U/L (ref 39–117)
BUN: 16 mg/dL (ref 6–23)
CO2: 27 meq/L (ref 19–32)
Calcium: 9.5 mg/dL (ref 8.4–10.5)
Chloride: 102 meq/L (ref 96–112)
Creatinine, Ser: 1.02 mg/dL (ref 0.40–1.20)
GFR: 54.67 mL/min — ABNORMAL LOW (ref >60.00)
Glucose, Bld: 96 mg/dL (ref 70–99)
Potassium: 4.6 meq/L (ref 3.5–5.1)
Sodium: 137 meq/L (ref 135–145)
Total Bilirubin: 0.6 mg/dL (ref 0.2–1.2)
Total Protein: 7.1 g/dL (ref 6.0–8.3)

## 2023-06-13 LAB — TSH: TSH: 2.85 u[IU]/mL (ref 0.35–5.50)

## 2023-06-13 MED ORDER — HEPATITIS A-HEP B RECOMB VAC 720-20 ELU-MCG/ML IM SUSY: 1.0000 mL | PREFILLED_SYRINGE | Freq: Once | INTRAMUSCULAR | 0 refills | Status: AC

## 2023-06-13 NOTE — Assessment & Plan Note (Signed)
There has been no increase in frequency of episodes since increasing her weekly cumulative levothyroxine dose.

## 2023-06-13 NOTE — Assessment & Plan Note (Addendum)
I have congratulated her in reduction of   BMI and encouraged  Continued  adherence to a low glycemic index lifestlyle and regular exercise

## 2023-06-13 NOTE — Progress Notes (Unsigned)
Subjective:  Patient ID: Joanne Taylor, female    DOB: 02-Nov-1950  Age: 72 y.o. MRN: [id number]  CC: The primary encounter diagnosis was Acquired hypothyroidism. Diagnoses of Overweight (BMI 25.0-29.9), Fatty liver disease, nonalcoholic, and Paroxysmal supraventricular tachycardia (HCC) were also pertinent to this visit.   HPI Joanne Taylor presents for  Chief Complaint  Patient presents with   Medical Management of Chronic Issues   1) MASH:  has lost 16 lbs since April  through diet and exercise . Has reduced alcohol use to less than one drink daily    2)  INTENTIONAL  WEIGHT LOSS OF 20 LBS WITH EXERCISE AND RESTRICTION OF DIET     Outpatient Medications Prior to Visit  Medication Sig Dispense Refill   Calcium Carb-Cholecalciferol 1000-800 MG-UNIT TABS Take by mouth.     estradiol (ESTRACE) 0.5 MG tablet TAKE 1 TABLET BY MOUTH DAILY 90 tablet 1   levothyroxine (SYNTHROID) 50 MCG tablet TAKE 1 TABLET BY MOUTH EVERY MORNING AND 2 TABLETS ON SUNDAYS 90 tablet 1   Multiple Vitamin (MULTIVITAMIN) tablet Take 1 tablet by mouth daily.     Omega-3 Fatty Acids (FISH OIL) 1200 MG CAPS Take 1 capsule by mouth daily.     Probiotic Product (PROBIOTIC FORMULA PO) Take by mouth.     progesterone (PROMETRIUM) 100 MG capsule TAKE 1 CAPSULE BY MOUTH DAILY 90 capsule 1   propranolol (INDERAL) 10 MG tablet TAKE 1 TABLET BY MOUTH 3 TIMES A DAY FOR RAPID HEART RATE 30 tablet 0   vitamin E 180 MG (400 UNITS) capsule      methylPREDNISolone (MEDROL DOSEPAK) 4 MG TBPK tablet Take as directed. 21 each 0   No facility-administered medications prior to visit.    Review of Systems;  Patient denies headache, fevers, malaise, unintentional weight loss, skin rash, eye pain, sinus congestion and sinus pain, sore throat, dysphagia,  hemoptysis , cough, dyspnea, wheezing, chest pain, palpitations, orthopnea, edema, abdominal pain, nausea, melena, diarrhea, constipation, flank pain, dysuria,  hematuria, urinary  Frequency, nocturia, numbness, tingling, seizures,  Focal weakness, Loss of consciousness,  Tremor, insomnia, depression, anxiety, and suicidal ideation.      Objective:  BP 122/64   Pulse 70   Ht 5\' 8"  (1.727 m)   Wt 174 lb 9.6 oz (79.2 kg)   SpO2 98%   BMI 26.55 kg/m   BP Readings from Last 3 Encounters:  06/13/23 122/64  11/11/22 122/78  10/05/22 120/80    Wt Readings from Last 3 Encounters:  06/13/23 174 lb 9.6 oz (79.2 kg)  11/11/22 190 lb 6.4 oz (86.4 kg)  10/21/22 188 lb (85.3 kg)    Physical Exam Vitals reviewed.  Constitutional:      General: She is not in acute distress.    Appearance: Normal appearance. She is normal weight. She is not ill-appearing, toxic-appearing or diaphoretic.  HENT:     Head: Normocephalic.  Eyes:     General: No scleral icterus.       Right eye: No discharge.        Left eye: No discharge.     Conjunctiva/sclera: Conjunctivae normal.  Cardiovascular:     Rate and Rhythm: Normal rate and regular rhythm.     Heart sounds: Normal heart sounds.  Pulmonary:     Effort: Pulmonary effort is normal. No respiratory distress.     Breath sounds: Normal breath sounds.  Musculoskeletal:        General: Normal range of motion.  Skin:  General: Skin is warm and dry.  Neurological:     General: No focal deficit present.     Mental Status: She is alert and oriented to person, place, and time. Mental status is at baseline.  Psychiatric:        Mood and Affect: Mood normal.        Behavior: Behavior normal.        Thought Content: Thought content normal.        Judgment: Judgment normal.   Lab Results  Component Value Date   HGBA1C 5.6 11/11/2022   HGBA1C 5.8 11/09/2021    Lab Results  Component Value Date   CREATININE 0.94 08/13/2022   CREATININE 0.89 11/09/2021   CREATININE 0.92 11/07/2020    Lab Results  Component Value Date   WBC 7.6 08/13/2022   HGB 14.2 08/13/2022   HCT 41.8 08/13/2022   PLT 296.0  08/13/2022   GLUCOSE 114 (H) 08/13/2022   CHOL 188 11/11/2022   TRIG 101.0 11/11/2022   HDL 68.00 11/11/2022   LDLDIRECT 99.0 11/11/2022   LDLCALC 100 (H) 11/11/2022   ALT 15 08/13/2022   AST 21 08/13/2022   NA 139 08/13/2022   K 3.8 08/13/2022   CL 103 08/13/2022   CREATININE 0.94 08/13/2022   BUN 19 08/13/2022   CO2 28 08/13/2022   TSH 3.69 08/13/2022   HGBA1C 5.6 11/11/2022    MM 3D SCREENING MAMMOGRAM BILATERAL BREAST  Result Date: 04/27/2023 CLINICAL DATA:  Screening. EXAM: DIGITAL SCREENING BILATERAL MAMMOGRAM WITH TOMOSYNTHESIS AND CAD TECHNIQUE: Bilateral screening digital craniocaudal and mediolateral oblique mammograms were obtained. Bilateral screening digital breast tomosynthesis was performed. The images were evaluated with computer-aided detection. COMPARISON:  Previous exam(s). ACR Breast Density Category b: There are scattered areas of fibroglandular density. FINDINGS: There are no findings suspicious for malignancy. IMPRESSION: No mammographic evidence of malignancy. A result letter of this screening mammogram will be mailed directly to the patient. RECOMMENDATION: Screening mammogram in one year. (Code:SM-B-01Y) BI-RADS CATEGORY  1: Negative. Electronically Signed   By: Edwin Cap M.D.   On: 04/27/2023 08:13    Assessment & Plan:  .Acquired hypothyroidism -     TSH  Overweight (BMI 25.0-29.9) Assessment & Plan: I have congratulated her in reduction of   BMI and encouraged  Continued  adherence to a low glycemic index lifestlyle and regular exercise   Orders: -     Comprehensive metabolic panel  Fatty liver disease, nonalcoholic Assessment & Plan: She has reduced the fat and refined sugars in her diet and HAS LOST 20 LBS SINCE APRIL.     Paroxysmal supraventricular tachycardia (HCC) Assessment & Plan: There has been no increase in frequency of episodes since increasing her weekly cumulative levothyroxine dose.    Other orders -     Hepatitis A-Hep  B Recomb Vac; Inject 1 mL into the muscle once for 1 dose. REPEAT AT 1 MONTH AND AT 6 MONTHS  Dispense: 1 mL; Refill: 0     I provided 30 minutes of face-to-face time during this encounter reviewing patient's last visit with me,  previous  labs and imaging studies, counseling on currently addressed issues,  and post visit ordering to diagnostics and therapeutics .   Follow-up: Return for physical.   Sherlene Shams, MD

## 2023-06-13 NOTE — Assessment & Plan Note (Signed)
She has reduced the fat and refined sugars in her diet and HAS LOST 20 LBS SINCE APRIL.

## 2023-06-13 NOTE — Patient Instructions (Signed)
EXCELLENT WEIGHT REDUCTION!  I KNEW YOU COULD DO IT!  SEE YOU IN 6 MONTHS  REMEMBER : DILL PICKLE JUICE (1 OUNCE) PREVENTS MUSCLE CRAMPS

## 2023-06-14 DIAGNOSIS — R944 Abnormal results of kidney function studies: Secondary | ICD-10-CM | POA: Insufficient documentation

## 2023-06-14 NOTE — Addendum Note (Signed)
Addended by: Sherlene Shams on: 06/14/2023 12:15 PM   Modules accepted: Orders

## 2023-06-14 NOTE — Assessment & Plan Note (Signed)
labs are normal, except for a slight drop in  GFR (kidney filtration rate)  .   plan to repeat the test in a non fasting state in one month

## 2023-06-15 ENCOUNTER — Encounter: Payer: Self-pay | Admitting: Internal Medicine

## 2023-07-14 ENCOUNTER — Other Ambulatory Visit (INDEPENDENT_AMBULATORY_CARE_PROVIDER_SITE_OTHER): Payer: Medicare PPO

## 2023-07-14 DIAGNOSIS — R944 Abnormal results of kidney function studies: Secondary | ICD-10-CM | POA: Diagnosis not present

## 2023-07-14 DIAGNOSIS — I471 Supraventricular tachycardia, unspecified: Secondary | ICD-10-CM | POA: Diagnosis not present

## 2023-07-14 LAB — BASIC METABOLIC PANEL
BUN: 15 mg/dL (ref 6–23)
CO2: 29 meq/L (ref 19–32)
Calcium: 8.9 mg/dL (ref 8.4–10.5)
Chloride: 103 meq/L (ref 96–112)
Creatinine, Ser: 0.88 mg/dL (ref 0.40–1.20)
GFR: 65.23 mL/min (ref >60.00)
Glucose, Bld: 94 mg/dL (ref 70–99)
Potassium: 4.2 meq/L (ref 3.5–5.1)
Sodium: 136 meq/L (ref 135–145)

## 2023-07-14 LAB — MAGNESIUM: Magnesium: 2.3 mg/dL (ref 1.5–2.5)

## 2023-08-23 ENCOUNTER — Encounter: Payer: Self-pay | Admitting: Internal Medicine

## 2023-08-25 ENCOUNTER — Other Ambulatory Visit: Payer: Self-pay | Admitting: Internal Medicine

## 2023-08-25 MED ORDER — FLUTICASONE PROPIONATE 50 MCG/ACT NA SUSP: 2.0000 | Freq: Every day | NASAL | 6 refills | Status: AC

## 2023-08-30 ENCOUNTER — Other Ambulatory Visit: Payer: Self-pay | Admitting: Internal Medicine

## 2023-08-31 ENCOUNTER — Other Ambulatory Visit: Payer: Self-pay | Admitting: Internal Medicine

## 2023-10-25 ENCOUNTER — Other Ambulatory Visit: Payer: Self-pay | Admitting: Internal Medicine

## 2023-11-08 ENCOUNTER — Ambulatory Visit (INDEPENDENT_AMBULATORY_CARE_PROVIDER_SITE_OTHER): Payer: Medicare PPO | Admitting: *Deleted

## 2023-11-08 VITALS — Ht 68.0 in | Wt 174.0 lb

## 2023-11-08 DIAGNOSIS — Z0000 Encounter for general adult medical examination without abnormal findings: Secondary | ICD-10-CM

## 2023-11-08 NOTE — Patient Instructions (Signed)
 Joanne Taylor , Thank you for taking time to come for your Medicare Wellness Visit. I appreciate your ongoing commitment to your health goals. Please review the following plan we discussed and let me know if I can assist you in the future.   Referrals/Orders/Follow-Ups/Clinician Recommendations: None  This is a list of the screening recommended for you and due dates:  Health Maintenance  Topic Date Due   COVID-19 Vaccine (5 - 2024-25 season) 04/10/2023   DTaP/Tdap/Td vaccine (2 - Td or Tdap) 02/08/2024   Flu Shot  03/09/2024   Mammogram  04/25/2024   Medicare Annual Wellness Visit  11/07/2024   Colon Cancer Screening  01/08/2026   Pneumonia Vaccine  Completed   DEXA scan (bone density measurement)  Completed   Hepatitis C Screening  Completed   Zoster (Shingles) Vaccine  Completed   HPV Vaccine  Aged Out    Advanced directives: (Copy Requested) Please bring a copy of your health care power of attorney and living will to the office to be added to your chart at your convenience. You can mail to [facility] [address] [place], [place] [address] or email to [email or web address]  Next Medicare Annual Wellness Visit scheduled for next year: Yes 11/12/24 @ 11:30

## 2023-11-08 NOTE — Progress Notes (Signed)
 Subjective:   Joanne Taylor is a 73 y.o. who presents for a Medicare Wellness preventive visit.  Visit Complete: Virtual I connected with  Joanne Taylor on 11/08/23 by a audio enabled telemedicine application and verified that I am speaking with the correct person using two identifiers.  Patient Location: Home  Provider Location: Office/Clinic  I discussed the limitations of evaluation and management by telemedicine. The patient expressed understanding and agreed to proceed.  Vital Signs: Because this visit was a virtual/telehealth visit, some criteria may be missing or patient reported. Any vitals not documented were not able to be obtained and vitals that have been documented are patient reported.  VideoDeclined- This patient declined Librarian, academic. Therefore the visit was completed with audio only.  Persons Participating in Visit: Patient.  AWV Questionnaire: Yes: Patient Medicare AWV questionnaire was completed by the patient on 11/04/23; I have confirmed that all information answered by patient is correct and no changes since this date.  Cardiac Risk Factors include: advanced age (>37men, >65 women)     Objective:    Today's Vitals   11/08/23 1051  Weight: 174 lb (78.9 kg)  Height: 5\' 8"  (1.727 m)   Body mass index is 26.46 kg/m.     11/08/2023   10:59 AM 10/21/2022   11:39 AM 10/13/2021   10:39 AM 10/10/2020   10:33 AM 10/10/2019   10:42 AM 10/09/2018   10:45 AM 07/18/2018   10:20 AM  Advanced Directives  Does Patient Have a Medical Advance Directive? Yes Yes Yes Yes Yes Yes No  Type of Estate agent of [place];Living will Healthcare Power of [place];Living will Healthcare Power of [place];Living will Healthcare Power of [place];Living will Healthcare Power of [place];Living will Healthcare Power of [place];Living will   Does patient want to make changes to medical advance directive?  No - Patient  declined No - Patient declined No - Patient declined No - Patient declined No - Patient declined   Copy of Healthcare Power of Attorney in Chart? No - copy requested No - copy requested No - copy requested No - copy requested No - copy requested No - copy requested     Current Medications (verified) Outpatient Encounter Medications as of 11/08/2023  Medication Sig   Calcium Carb-Cholecalciferol 1000-800 MG-UNIT TABS Take by mouth.   estradiol (ESTRACE) 0.5 MG tablet TAKE 1 TABLET BY MOUTH DAILY   fluticasone (FLONASE) 50 MCG/ACT nasal spray Place 2 sprays into both nostrils daily.   levothyroxine (SYNTHROID) 50 MCG tablet TAKE 1 TABLET BY MOUTH EVERY MORNING AND TAKE 2 TABLETS ON SUNDAYS   Multiple Vitamin (MULTIVITAMIN) tablet Take 1 tablet by mouth daily.   Omega-3 Fatty Acids (FISH OIL) 1200 MG CAPS Take 1 capsule by mouth daily.   Probiotic Product (PROBIOTIC FORMULA PO) Take by mouth.   progesterone (PROMETRIUM) 100 MG capsule TAKE 1 CAPSULE BY MOUTH DAILY   propranolol (INDERAL) 10 MG tablet TAKE 1 TABLET BY MOUTH 3 TIMES A DAY FOR RAPID HEART RATE   vitamin E 180 MG (400 UNITS) capsule    No facility-administered encounter medications on file as of 11/08/2023.    Allergies (verified) Cortisone and Omeprazole   History: Past Medical History:  Diagnosis Date   Basal cell carcinoma (BCC) of right upper arm 11/07/2019   Osteopenia    Thyroid disease    Past Surgical History:  Procedure Laterality Date   COLONOSCOPY     COLONOSCOPY WITH PROPOFOL N/A 01/09/2016  Procedure: COLONOSCOPY WITH PROPOFOL;  Surgeon: Christena Deem, MD;  Location[facility] END[facility]COPY;  Service: Endoscopy;  Laterality: N/A;   TUBAL LIGATION     Family History  Problem Relation Age of Onset   Hyperlipidemia Mother    Hypertension Mother    Mental illness Mother        depressive disorder   Congestive Heart Failure Mother    Cancer Father        prostate   Mental illness Father        lewy body  Dementia   Hypertension Brother    Breast cancer Neg Hx    Social History   Socioeconomic History   Marital status: Married    Spouse name: Not on file   Number of children: Not on file   Years of education: Not on file   Highest education level: Master's degree (e.g., MA, MS, MEng, MEd, MSW, MBA)  Occupational History   Occupation: Asst Film/video editor: acc    Comment: at [employer]  Tobacco Use   Smoking status: Former    Current packs/day: 0.00    Types: Cigarettes    Quit date: 02/07/1972    Years since quitting: 51.7   Smokeless tobacco: Never  Substance and Sexual Activity   Alcohol use: Yes    Alcohol/week: 5.0 standard drinks of alcohol    Types: 5 Standard drinks or equivalent per week   Drug use: No   Sexual activity: Not on file  Other Topics Concern   Not on file  Social History Narrative   Married   Social Drivers of Health   Financial Resource Strain: Low Risk  (11/04/2023)   Overall Financial Resource Strain (CARDIA)    Difficulty of Paying Living Expenses: Not hard at all  Food Insecurity: No Food Insecurity (11/04/2023)   Hunger Vital Sign    Worried About Running Out of Food in the Last Year: Never true    Ran Out of Food in the Last Year: Never true  Transportation Needs: No Transportation Needs (11/04/2023)   PRAPARE - Administrator, Civil Service (Medical): No    Lack of Transportation (Non-Medical): No  Physical Activity: Insufficiently Active (11/04/2023)   Exercise Vital Sign    Days of Exercise per Week: 3 days    Minutes of Exercise per Session: 40 min  Stress: No Stress Concern Present (11/04/2023)   [employer] of Occupational Health - Occupational Stress Questionnaire    Feeling of Stress : Not at all  Social Connections: Socially Integrated (11/04/2023)   Social Connection and Isolation Panel [NHANES]    Frequency of Communication with Friends and Family: More than three times a week    Frequency of Social Gatherings with  Friends and Family: Twice a week    Attends Religious Services: More than 4 times per year    Active Member of [employer] or Organizations: Yes    Attends Engineer, structural: More than 4 times per year    Marital Status: Married    Tobacco Counseling Counseling given: Not Answered    Clinical Intake:  Pre-visit preparation completed: Yes  Pain : No/denies pain     BMI - recorded: 26.46 Nutritional Status: BMI 25 -29 Overweight Nutritional Risks: None Diabetes: No  Lab Results  Component Value Date   HGBA1C 5.6 11/11/2022   HGBA1C 5.8 11/09/2021     How often do you need to have someone help you when you read instructions, pamphlets, or  other written materials from your doctor or pharmacy?: 1 - Never  Interpreter Needed?: No  Information entered by :: R. Zavannah Deblois LPN   Activities of Daily Living     11/04/2023    8:41 AM  In your present state of health, do you have any difficulty performing the following activities:  Hearing? 0  Vision? 0  Comment glasses  Difficulty concentrating or making decisions? 0  Walking or climbing stairs? 0  Dressing or bathing? 0  Doing errands, shopping? 0  Preparing Food and eating ? N  Using the Toilet? N  In the past six months, have you accidently leaked urine? Y  Do you have problems with loss of bowel control? N  Managing your Medications? N  Managing your Finances? N  Housekeeping or managing your Housekeeping? N    Patient Care Team: Sherlene Shams, MD as PCP - General (Internal Medicine) Iran Ouch, MD as PCP - Cardiology (Cardiology)  Indicate any recent Medical Services you may have received from other than [facility] providers in the past year (date may be approximate).     Assessment:   This is a routine wellness examination for Joanne Taylor.  Hearing/Vision screen Hearing Screening - Comments:: No issues Vision Screening - Comments:: glasses   Goals Addressed             This Visit's Progress     Patient Stated       Wants to lose more weight and keep it off, exercise and stay active       Depression Screen     11/08/2023   10:55 AM 06/13/2023   12:53 PM 11/11/2022    8:32 AM 10/21/2022   11:39 AM 09/29/2022    8:22 AM 08/13/2022   12:57 PM 11/09/2021    8:35 AM  PHQ 2/9 Scores  PHQ - 2 Score 0 0 0 0 0 0 0  PHQ- 9 Score 0  0 0 0 1     Fall Risk     11/04/2023    8:41 AM 06/13/2023   12:53 PM 11/11/2022    8:32 AM 10/18/2022    8:36 AM 09/29/2022    8:22 AM  Fall Risk   Falls in the past year? 0 1 0 0 0  Number falls in past yr: 0 0 0 0 0  Injury with Fall? 0 0 0 0 0  Risk for fall due to : No Fall Risks History of fall(s) No Fall Risks  No Fall Risks  Follow up Falls prevention discussed;Falls evaluation completed Falls evaluation completed Falls evaluation completed Falls evaluation completed;Falls prevention discussed Falls evaluation completed    MEDICARE RISK AT HOME:  Medicare Risk at Home Any stairs in or around the home?: (Patient-Rptd) Yes If so, are there any without handrails?: (Patient-Rptd) No Home free of loose throw rugs in walkways, pet beds, electrical cords, etc?: (Patient-Rptd) Yes Adequate lighting in your home to reduce risk of falls?: (Patient-Rptd) Yes Life alert?: (Patient-Rptd) No Use of a cane, walker or w/c?: (Patient-Rptd) No Grab bars in the bathroom?: (Patient-Rptd) Yes Shower chair or bench in shower?: (Patient-Rptd) Yes Elevated toilet seat or a handicapped toilet?: (Patient-Rptd) Yes  TIMED UP AND GO:  Was the test performed?  No  Cognitive Function: 6CIT completed    10/09/2018   10:55 AM 09/16/2017    4:35 PM  MMSE - Mini Mental State Exam  Orientation to time 5 5  Orientation to Place 5 5  Registration 3 3  Attention/ Calculation 5 5  Recall 3 3  Language- name 2 objects 2 2  Language- repeat 1 1  Language- follow 3 step command 3 3  Language- read & follow direction 1 1  Write a sentence 1 1  Copy design 1 1  Total score 30  30        11/08/2023   11:00 AM 10/21/2022   11:41 AM 10/10/2019   10:47 AM  6CIT Screen  What Year? 0 points 0 points 0 points  What month? 0 points 0 points 0 points  What time? 0 points 0 points 0 points  Count back from 20 0 points 0 points 0 points  Months in reverse 0 points 0 points 0 points  Repeat phrase 0 points 0 points 0 points  Total Score 0 points 0 points 0 points    Immunizations Immunization History  Administered Date(s) Administered   Influenza Split 05/23/2012   Influenza, High Dose Seasonal PF 05/24/2018   Influenza-Unspecified 06/24/2013, 05/20/2015, 05/16/2019, 05/07/2020, 05/18/2021, 04/30/2022, 05/23/2023   PFIZER(Purple Top)SARS-COV-2 Vaccination 09/18/2019, 10/09/2019, 05/26/2020, 01/09/2021   Pneumococcal Conjugate-13 05/28/2015   Pneumococcal Polysaccharide-23 11/16/2016   Rsv, Bivalent, Protein Subunit Rsvpref,pf Verdis Frederickson) 04/30/2022   Tdap 02/07/2014   Zoster Recombinant(Shingrix) 09/15/2021, 11/07/2021   Zoster, Live 11/01/2013    Screening Tests Health Maintenance  Topic Date Due   COVID-19 Vaccine (5 - 2024-25 season) 04/10/2023   Medicare Annual Wellness (AWV)  10/21/2023   DTaP/Tdap/Td (2 - Td or Tdap) 02/08/2024   INFLUENZA VACCINE  03/09/2024   MAMMOGRAM  04/25/2024   Colonoscopy  01/08/2026   Pneumonia Vaccine 22+ Years old  Completed   DEXA SCAN  Completed   Hepatitis C Screening  Completed   Zoster Vaccines- Shingrix  Completed   HPV VACCINES  Aged Out    Health Maintenance  Health Maintenance Due  Topic Date Due   COVID-19 Vaccine (5 - 2024-25 season) 04/10/2023   Medicare Annual Wellness (AWV)  10/21/2023   Health Maintenance Items Addressed: Patient declines covid vaccine  Additional Screening:  Vision Screening: Recommended annual ophthalmology exams for early detection of glaucoma and other disorders of the eye. Up to date  [facility]  Dental Screening: Recommended annual dental exams for proper oral  hygiene  Community Resource Referral / Chronic Care Management: CRR required this visit?  No   CCM required this visit?  No     Plan:     I have personally reviewed and noted the following in the patient's chart:   Medical and social history Use of alcohol, tobacco or illicit drugs  Current medications and supplements including opioid prescriptions. Patient is not currently taking opioid prescriptions. Functional ability and status Nutritional status Physical activity Advanced directives List of other physicians Hospitalizations, surgeries, and ER visits in previous 12 months Vitals Screenings to include cognitive, depression, and falls Referrals and appointments  In addition, I have reviewed and discussed with patient certain preventive protocols, quality metrics, and best practice recommendations. A written personalized care plan for preventive services as well as general preventive health recommendations were provided to patient.     Sydell Axon, LPN   11/13/9627   After Visit Summary: (MyChart) Due to this being a telephonic visit, the after visit summary with patients personalized plan was offered to patient via MyChart   Notes: Nothing significant to report at this time.

## 2023-11-26 ENCOUNTER — Other Ambulatory Visit: Payer: Self-pay | Admitting: Internal Medicine

## 2023-12-12 ENCOUNTER — Encounter: Payer: Self-pay | Admitting: Internal Medicine

## 2023-12-12 ENCOUNTER — Ambulatory Visit (INDEPENDENT_AMBULATORY_CARE_PROVIDER_SITE_OTHER): Payer: Medicare PPO | Admitting: Internal Medicine

## 2023-12-12 VITALS — BP 114/72 | HR 65 | Ht 68.0 in | Wt 177.0 lb

## 2023-12-12 DIAGNOSIS — E039 Hypothyroidism, unspecified: Secondary | ICD-10-CM | POA: Diagnosis not present

## 2023-12-12 DIAGNOSIS — I471 Supraventricular tachycardia, unspecified: Secondary | ICD-10-CM

## 2023-12-12 DIAGNOSIS — Z0000 Encounter for general adult medical examination without abnormal findings: Secondary | ICD-10-CM | POA: Diagnosis not present

## 2023-12-12 DIAGNOSIS — E663 Overweight: Secondary | ICD-10-CM

## 2023-12-12 DIAGNOSIS — K760 Fatty (change of) liver, not elsewhere classified: Secondary | ICD-10-CM

## 2023-12-12 DIAGNOSIS — E785 Hyperlipidemia, unspecified: Secondary | ICD-10-CM | POA: Diagnosis not present

## 2023-12-12 DIAGNOSIS — Z1231 Encounter for screening mammogram for malignant neoplasm of breast: Secondary | ICD-10-CM

## 2023-12-12 LAB — COMPREHENSIVE METABOLIC PANEL WITH GFR
ALT: 12 U/L (ref 0–35)
AST: 17 U/L (ref 0–37)
Albumin: 4.2 g/dL (ref 3.5–5.2)
Alkaline Phosphatase: 60 U/L (ref 39–117)
BUN: 19 mg/dL (ref 6–23)
CO2: 29 meq/L (ref 19–32)
Calcium: 9.4 mg/dL (ref 8.4–10.5)
Chloride: 103 meq/L (ref 96–112)
Creatinine, Ser: 0.93 mg/dL (ref 0.40–1.20)
GFR: 60.87 mL/min (ref >60.00)
Glucose, Bld: 97 mg/dL (ref 70–99)
Potassium: 4.5 meq/L (ref 3.5–5.1)
Sodium: 138 meq/L (ref 135–145)
Total Bilirubin: 0.6 mg/dL (ref 0.2–1.2)
Total Protein: 6.9 g/dL (ref 6.0–8.3)

## 2023-12-12 LAB — LDL CHOLESTEROL, DIRECT: Direct LDL: 96 mg/dL

## 2023-12-12 LAB — LIPID PANEL
Cholesterol: 197 mg/dL (ref 0–200)
HDL: 70.3 mg/dL (ref >39.00)
LDL Cholesterol: 101 mg/dL — ABNORMAL HIGH (ref 0–99)
NonHDL: 126.24
Total CHOL/HDL Ratio: 3
Triglycerides: 126 mg/dL (ref 0.0–149.0)
VLDL: 25.2 mg/dL (ref 0.0–40.0)

## 2023-12-12 LAB — TSH: TSH: 3.09 u[IU]/mL (ref 0.35–5.50)

## 2023-12-12 MED ORDER — ESTRADIOL 0.5 MG PO TABS: 0.5000 mg | ORAL_TABLET | Freq: Every day | ORAL | 1 refills | Status: DC

## 2023-12-12 NOTE — Progress Notes (Unsigned)
 Patient ID: Joanne Taylor, female    DOB: 1951-06-02  Age: 73 y.o. MRN: [id number]  The patient is here for annual  preventive  examination and management of other chronic and acute problems.   The risk factors are reflected in the social history.  The roster of all physicians providing medical care to patient - is listed in the Snapshot section of the chart.  Activities of daily living:  The patient is 100% independent in all ADLs: dressing, toileting, feeding as well as independent mobility  Home safety : The patient has smoke detectors in the home. They wear seatbelts.  There are no firearms at home. There is no violence in the home.   There is no risks for hepatitis, STDs or HIV. There is no   history of blood transfusion. They have no travel history to infectious disease endemic areas of the world.  The patient has seen their dentist in the last six month. They have seen their eye doctor in the last year. They admit to slight hearing difficulty with regard to whispered voices and some television programs.  They have deferred audiologic testing in the last year.  They do not  have excessive sun exposure. Discussed the need for sun protection: hats, long sleeves and use of sunscreen if there is significant sun exposure.   Diet: the importance of a healthy diet is discussed. They do have a healthy diet.  The benefits of regular aerobic exercise were discussed. She walks 4 times per week ,  20 minutes.   Depression screen: there are no signs or vegative symptoms of depression- irritability, change in appetite, anhedonia, sadness/tearfullness.  Cognitive assessment: the patient manages all their financial and personal affairs and is actively engaged. They could relate day,date,year and events; recalled 2/3 objects at 3 minutes; performed clock-face test normally.  The following portions of the patient's history were reviewed and updated as appropriate: allergies, current medications, past  family history, past medical history,  past surgical history, past social history  and problem list.  Visual acuity was not assessed per patient preference since she has regular follow up with her ophthalmologist. Hearing and body mass index were assessed and reviewed.   During the course of the visit the patient was educated and counseled about appropriate screening and preventive services including : fall prevention , diabetes screening, nutrition counseling, colorectal cancer screening, and recommended immunizations.    CC: The primary encounter diagnosis was Encounter for screening mammogram for malignant neoplasm of breast. Diagnoses of Acquired hypothyroidism, Overweight (BMI 25.0-29.9), and Hyperlipidemia, unspecified hyperlipidemia type were also pertinent to this visit.  History Vittoria has a past medical history of Basal cell carcinoma (BCC) of right upper arm (11/07/2019), Osteopenia, and Thyroid  disease.   She has a past surgical history that includes Tubal ligation; Colonoscopy; and Colonoscopy with propofol  (N/A, 01/09/2016).   Her family history includes Cancer in her father; Congestive Heart Failure in her mother; Hyperlipidemia in her mother; Hypertension in her brother and mother; Mental illness in her father and mother.She reports that she quit smoking about 51 years ago. Her smoking use included cigarettes. She has never used smokeless tobacco. She reports current alcohol use of about 5.0 standard drinks of alcohol per week. She reports that she does not use drugs.  Outpatient Medications Prior to Visit  Medication Sig Dispense Refill  . Calcium Carb-Cholecalciferol 1000-800 MG-UNIT TABS Take by mouth.    . estradiol  (ESTRACE ) 0.5 MG tablet TAKE 1 TABLET BY MOUTH DAILY 90  tablet 0  . fluticasone  (FLONASE ) 50 MCG/ACT nasal spray Place 2 sprays into both nostrils daily. 16 g 6  . levothyroxine  (SYNTHROID ) 50 MCG tablet TAKE 1 TABLET BY MOUTH EVERY MORNING AND TAKE 2 TABLETS ON  SUNDAYS 90 tablet 1  . Multiple Vitamin (MULTIVITAMIN) tablet Take 1 tablet by mouth daily.    . Omega-3 Fatty Acids (FISH OIL) 1200 MG CAPS Take 1 capsule by mouth daily.    . Probiotic Product (PROBIOTIC FORMULA PO) Take by mouth.    . progesterone  (PROMETRIUM ) 100 MG capsule TAKE 1 CAPSULE BY MOUTH DAILY 90 capsule 1  . propranolol  (INDERAL ) 10 MG tablet TAKE 1 TABLET BY MOUTH 3 TIMES A DAY FOR RAPID HEART RATE 30 tablet 0  . vitamin E 180 MG (400 UNITS) capsule      No facility-administered medications prior to visit.    Review of Systems  Objective:  BP 114/72   Pulse 65   Ht 5\' 8"  (1.727 m)   Wt 177 lb (80.3 kg)   SpO2 98%   BMI 26.91 kg/m   Physical Exam Vitals reviewed.  Constitutional:      General: She is not in acute distress.    Appearance: Normal appearance. She is well-developed and normal weight. She is not ill-appearing, toxic-appearing or diaphoretic.  HENT:     Head: Normocephalic.     Right Ear: Tympanic membrane, ear canal and external ear normal. There is no impacted cerumen.     Left Ear: Tympanic membrane, ear canal and external ear normal. There is no impacted cerumen.     Nose: Nose normal.     Mouth/Throat:     Mouth: Mucous membranes are moist.     Pharynx: Oropharynx is clear.  Eyes:     General: No scleral icterus.       Right eye: No discharge.        Left eye: No discharge.     Conjunctiva/sclera: Conjunctivae normal.     Pupils: Pupils are equal, round, and reactive to light.  Neck:     Thyroid : No thyromegaly.     Vascular: No carotid bruit or JVD.  Cardiovascular:     Rate and Rhythm: Normal rate and regular rhythm.     Heart sounds: Normal heart sounds.  Pulmonary:     Effort: Pulmonary effort is normal. No respiratory distress.     Breath sounds: Normal breath sounds.  Chest:  Breasts:    Breasts are symmetrical.     Right: Normal. No swelling, inverted nipple, mass, nipple discharge, skin change or tenderness.     Left:  Normal. No swelling, inverted nipple, mass, nipple discharge, skin change or tenderness.  Abdominal:     General: Bowel sounds are normal.     Palpations: Abdomen is soft. There is no mass.     Tenderness: There is no abdominal tenderness. There is no guarding or rebound.  Musculoskeletal:        General: Normal range of motion.     Cervical back: Normal range of motion and neck supple.  Lymphadenopathy:     Cervical: No cervical adenopathy.     Upper Body:     Right upper body: No supraclavicular, axillary or pectoral adenopathy.     Left upper body: No supraclavicular, axillary or pectoral adenopathy.  Skin:    General: Skin is warm and dry.  Neurological:     General: No focal deficit present.     Mental Status: She is alert and  oriented to person, place, and time. Mental status is at baseline.  Psychiatric:        Mood and Affect: Mood normal.        Behavior: Behavior normal.        Thought Content: Thought content normal.        Judgment: Judgment normal.   Assessment & Plan:  Encounter for screening mammogram for malignant neoplasm of breast  Acquired hypothyroidism  Overweight (BMI 25.0-29.9)  Hyperlipidemia, unspecified hyperlipidemia type      I provided 40 minutes of  face-to-face time during this encounter reviewing patient's current problems and past surgeries,  recent labs and imaging studies, providing counseling on the above mentioned problems , and coordination  of care .   Follow-up: No follow-ups on file.   Thersia Flax, MD

## 2023-12-12 NOTE — Patient Instructions (Addendum)
  You are due for your tetanus-diptheria-pertussis vaccine   (TDaP) I'm July.  Please get it at your pharmacy

## 2023-12-13 ENCOUNTER — Encounter: Payer: Self-pay | Admitting: Internal Medicine

## 2023-12-13 NOTE — Assessment & Plan Note (Signed)
 There has been no increase in frequency of episodes since increasing her weekly cumulative levothyroxine  dose.   She has low dose inderal  to use prn

## 2023-12-13 NOTE — Assessment & Plan Note (Signed)
 She has reduced the fat and refined sugars in her diet and has reduced alcohol use to less than daily .  lFTs are normal   Lab Results  Component Value Date   ALT 12 12/12/2023   AST 17 12/12/2023   ALKPHOS 60 12/12/2023   BILITOT 0.6 12/12/2023

## 2023-12-13 NOTE — Assessment & Plan Note (Signed)

## 2023-12-23 DIAGNOSIS — H02056 Trichiasis without entropian left eye, unspecified eyelid: Secondary | ICD-10-CM | POA: Diagnosis not present

## 2023-12-23 DIAGNOSIS — H02883 Meibomian gland dysfunction of right eye, unspecified eyelid: Secondary | ICD-10-CM | POA: Diagnosis not present

## 2023-12-26 DIAGNOSIS — H02883 Meibomian gland dysfunction of right eye, unspecified eyelid: Secondary | ICD-10-CM | POA: Diagnosis not present

## 2023-12-26 DIAGNOSIS — H16223 Keratoconjunctivitis sicca, not specified as Sjogren's, bilateral: Secondary | ICD-10-CM | POA: Diagnosis not present

## 2023-12-26 DIAGNOSIS — H2513 Age-related nuclear cataract, bilateral: Secondary | ICD-10-CM | POA: Diagnosis not present

## 2023-12-26 DIAGNOSIS — Z0100 Encounter for examination of eyes and vision without abnormal findings: Secondary | ICD-10-CM | POA: Diagnosis not present

## 2024-01-14 ENCOUNTER — Other Ambulatory Visit: Payer: Self-pay | Admitting: Internal Medicine

## 2024-01-17 DIAGNOSIS — D2271 Melanocytic nevi of right lower limb, including hip: Secondary | ICD-10-CM | POA: Diagnosis not present

## 2024-01-17 DIAGNOSIS — L409 Psoriasis, unspecified: Secondary | ICD-10-CM | POA: Diagnosis not present

## 2024-01-17 DIAGNOSIS — D2272 Melanocytic nevi of left lower limb, including hip: Secondary | ICD-10-CM | POA: Diagnosis not present

## 2024-01-17 DIAGNOSIS — D485 Neoplasm of uncertain behavior of skin: Secondary | ICD-10-CM | POA: Diagnosis not present

## 2024-01-17 DIAGNOSIS — Z85828 Personal history of other malignant neoplasm of skin: Secondary | ICD-10-CM | POA: Diagnosis not present

## 2024-01-17 DIAGNOSIS — L821 Other seborrheic keratosis: Secondary | ICD-10-CM | POA: Diagnosis not present

## 2024-01-17 DIAGNOSIS — Z08 Encounter for follow-up examination after completed treatment for malignant neoplasm: Secondary | ICD-10-CM | POA: Diagnosis not present

## 2024-01-17 DIAGNOSIS — D2261 Melanocytic nevi of right upper limb, including shoulder: Secondary | ICD-10-CM | POA: Diagnosis not present

## 2024-01-17 DIAGNOSIS — D2262 Melanocytic nevi of left upper limb, including shoulder: Secondary | ICD-10-CM | POA: Diagnosis not present

## 2024-01-17 DIAGNOSIS — D225 Melanocytic nevi of trunk: Secondary | ICD-10-CM | POA: Diagnosis not present

## 2024-01-18 ENCOUNTER — Encounter: Payer: Self-pay | Admitting: Cardiology

## 2024-01-18 ENCOUNTER — Ambulatory Visit: Attending: Cardiology | Admitting: Cardiology

## 2024-01-18 VITALS — BP 90/62 | HR 75 | Ht 68.0 in | Wt 175.4 lb

## 2024-01-18 DIAGNOSIS — R002 Palpitations: Secondary | ICD-10-CM | POA: Diagnosis not present

## 2024-01-18 DIAGNOSIS — I471 Supraventricular tachycardia, unspecified: Secondary | ICD-10-CM

## 2024-01-18 DIAGNOSIS — R000 Tachycardia, unspecified: Secondary | ICD-10-CM

## 2024-01-18 DIAGNOSIS — I517 Cardiomegaly: Secondary | ICD-10-CM | POA: Diagnosis not present

## 2024-01-18 NOTE — Progress Notes (Signed)
 Cardiology Office Note   Date:  01/18/2024  ID:  Chrystal, Zeimet 22-Sep-1950, MRN [id number] PCP: Thersia Flax, MD  [facility] Providers Cardiologist:  Antionette Kirks, MD     History of Present Illness Joanne Taylor is a 73 y.o. female with a past medical history of paroxysmal supraventricular tachycardia, hypothyroidism, fatty liver disease, who presents today for follow-up.  She was last seen in clinic 10/05/2022 by Dr.Arida.  She had been referred by Dr. Concha Deed for evaluation of her paroxysmal SVT.  She had no prior cardiac history and overall was very active with no chest pain or shortness of breath.  She stated she had recently been attending a reunion and had wanted to drink.  Her smart watch alerted her tachycardia with heart rate averaged 126 bpm.  She was overall asymptomatic but the tachycardia continued for a few hours.  She denies any other associated symptoms of dizziness, syncope or near syncope.  She had recurrent episodes when she had alcohol again.  She cut down alcohol intake.  Symptoms improved.  She was scheduled for a monitor for 2 weeks.  She returns to clinic today stating overall from a cardiac perspective she has been doing well.  She has had approximately 5 episodes of elevated heart rate over the past year but was taken as needed propranolol  approximately 1 time.  She denies any chest pain, shortness of breath, peripheral edema.  States that she has been compliant with her current medication regimen without any undue side effects.  She denies any hospitalizations or visits to the emergency department.  ROS: 10 point review of system has been reviewed and considered negative except what is listed in the HPI  Studies Reviewed EKG Interpretation Date/Time:  Wednesday January 18 2024 14:55:23 EDT Ventricular Rate:  75 PR Interval:  150 QRS Duration:  82 QT Interval:  376 QTC Calculation: 419 R Axis:   0  Text Interpretation: Normal sinus rhythm  Normal ECG When compared with ECG of 27-Sep-2015 11:11, No significant change was found Confirmed by Ronald Cockayne ([id number]) on 01/18/2024 2:58:43 PM    Event Monitor (Z[employer]) 10/28/2022 Patient had a min HR of 56 bpm, max HR of 203 bpm, and avg HR of 83 bpm. Predominant underlying rhythm was Sinus Rhythm.  13 Supraventricular Tachycardia runs occurred, the run with the fastest interval lasting 4 beats with a max rate of 203 bpm, the  longest lasting 17 beats with an avg rate of 150 bpm.  Rare PACs and rare PVCs.  Risk Assessment/Calculations         Physical Exam VS:  BP 90/62 (BP Location: Left Arm, Patient Position: Sitting, Cuff Size: Normal)   Pulse 75   Ht 5' 8 (1.727 m)   Wt 175 lb 6 oz (79.5 kg)   SpO2 98%   BMI 26.67 kg/m    Wt Readings from Last 3 Encounters:  01/18/24 175 lb 6 oz (79.5 kg)  12/12/23 177 lb (80.3 kg)  11/08/23 174 lb (78.9 kg)    GEN: Well nourished, well developed in no acute distress NECK: No JVD; No carotid bruits CARDIAC: RRR, no murmurs, rubs, gallops RESPIRATORY:  Clear to auscultation without rales, wheezing or rhonchi  ABDOMEN: Soft, non-tender, non-distended EXTREMITIES:  No edema; No deformity   ASSESSMENT AND PLAN Intermittent tachycardia/paroxysmal supraventricular tachycardia/palpitations which she has had recurrence approximately 5 times in the last year and it is only taking her as needed propranolol  once.  Previous ZIO monitor worn  revealed an average heart rate of 83 bpm with an underlying rhythm of sinus, 13 supraventricular tachycardia runs occurred lasting 17 beats with the longest interval, rare PACs and rare PVCs.  She states that if she has issues they are typically when she lies down in the evening, when she has alcohol, or when she has been working in the yard.  We have discussed today triggers which are stress, fatigue, caffeine, dehydration, and alcohol.  She has been advised to notify us  if she has any reoccurrence that happens more  frequently or is more intense or if she starts taking her as needed propranolol  on a more regular basis at that time her medication regimen may need to be changed to something daily.  LVH noted on EKG with a rate of 75 with continued LVH with no acute changes from prior studies.  Will continue to monitor with surveillance studies.  She has no history of hypertension and her cardiac exam is unremarkable.  If there are any changes or increased palpitations symptoms of shortness of breath we will obtain an echocardiogram at that time.       Dispo: Patient return to clinic to see MD/APP in 11 to 12 months or sooner if needed  Signed, Perry Molla, NP

## 2024-01-18 NOTE — Patient Instructions (Signed)
 Medication Instructions:  Your physician recommends that you continue on your current medications as directed. Please refer to the Current Medication list given to you today.   *If you need a refill on your cardiac medications before your next appointment, please call your pharmacy*  Lab Work: No labs ordered today  If you have labs (blood work) drawn today and your tests are completely normal, you will receive your results only by: MyChart Message (if you have MyChart) OR A paper copy in the mail If you have any lab test that is abnormal or we need to change your treatment, we will call you to review the results.  Testing/Procedures: No test ordered today   Follow-Up: At [facility], you and your health needs are our priority.  As part of our continuing mission to provide you with exceptional heart care, our providers are all part of one team.  This team includes your primary Cardiologist (physician) and Advanced Practice Providers or APPs (Physician Assistants and Nurse Practitioners) who all work together to provide you with the care you need, when you need it.  Your next appointment:   12 month(s)  Provider:   Antionette Kirks, MD or Ronald Cockayne, NP

## 2024-02-20 ENCOUNTER — Encounter: Payer: Self-pay | Admitting: Internal Medicine

## 2024-02-24 ENCOUNTER — Other Ambulatory Visit: Payer: Self-pay | Admitting: Internal Medicine

## 2024-04-26 ENCOUNTER — Ambulatory Visit
Admission: RE | Admit: 2024-04-26 | Discharge: 2024-04-26 | Disposition: A | Discharge status: Home / Self Care | Source: Ambulatory Visit | Attending: Internal Medicine | Admitting: Internal Medicine

## 2024-04-26 DIAGNOSIS — Z1231 Encounter for screening mammogram for malignant neoplasm of breast: Secondary | ICD-10-CM | POA: Diagnosis not present

## 2024-05-10 ENCOUNTER — Encounter: Payer: Self-pay | Admitting: Internal Medicine

## 2024-06-13 ENCOUNTER — Encounter: Payer: Self-pay | Admitting: Internal Medicine

## 2024-06-13 ENCOUNTER — Ambulatory Visit: Admitting: Internal Medicine

## 2024-06-13 VITALS — BP 118/54 | HR 63 | Ht 68.0 in | Wt 178.2 lb

## 2024-06-13 DIAGNOSIS — K5900 Constipation, unspecified: Secondary | ICD-10-CM | POA: Diagnosis not present

## 2024-06-13 DIAGNOSIS — R5383 Other fatigue: Secondary | ICD-10-CM

## 2024-06-13 DIAGNOSIS — E039 Hypothyroidism, unspecified: Secondary | ICD-10-CM

## 2024-06-13 DIAGNOSIS — E559 Vitamin D deficiency, unspecified: Secondary | ICD-10-CM | POA: Diagnosis not present

## 2024-06-13 DIAGNOSIS — K760 Fatty (change of) liver, not elsewhere classified: Secondary | ICD-10-CM | POA: Diagnosis not present

## 2024-06-13 LAB — CBC WITH DIFFERENTIAL/PLATELET
Basophils Absolute: 0 K/uL (ref 0.0–0.1)
Basophils Relative: 0.8 % (ref 0.0–3.0)
Eosinophils Absolute: 0.1 K/uL (ref 0.0–0.7)
Eosinophils Relative: 1.4 % (ref 0.0–5.0)
HCT: 41.7 % (ref 36.0–46.0)
Hemoglobin: 14 g/dL (ref 12.0–15.0)
Lymphocytes Relative: 34.5 % (ref 12.0–46.0)
Lymphs Abs: 2 K/uL (ref 0.7–4.0)
MCHC: 33.7 g/dL (ref 30.0–36.0)
MCV: 89.8 fl (ref 78.0–100.0)
Monocytes Absolute: 0.5 K/uL (ref 0.1–1.0)
Monocytes Relative: 8.3 % (ref 3.0–12.0)
Neutro Abs: 3.2 K/uL (ref 1.4–7.7)
Neutrophils Relative %: 55 % (ref 43.0–77.0)
Platelets: 260 K/uL (ref 150.0–400.0)
RBC: 4.64 Mil/uL (ref 3.87–5.11)
RDW: 13.9 % (ref 11.5–15.5)
WBC: 5.9 K/uL (ref 4.0–10.5)

## 2024-06-13 LAB — LIPID PANEL
Cholesterol: 212 mg/dL — ABNORMAL HIGH (ref 0–200)
HDL: 74.1 mg/dL (ref >39.00)
LDL Cholesterol: 117 mg/dL — ABNORMAL HIGH (ref 0–99)
NonHDL: 137.41
Total CHOL/HDL Ratio: 3
Triglycerides: 102 mg/dL (ref 0.0–149.0)
VLDL: 20.4 mg/dL (ref 0.0–40.0)

## 2024-06-13 LAB — COMPREHENSIVE METABOLIC PANEL WITH GFR
ALT: 12 U/L (ref 0–35)
AST: 20 U/L (ref 0–37)
Albumin: 4.4 g/dL (ref 3.5–5.2)
Alkaline Phosphatase: 68 U/L (ref 39–117)
BUN: 14 mg/dL (ref 6–23)
CO2: 29 meq/L (ref 19–32)
Calcium: 9 mg/dL (ref 8.4–10.5)
Chloride: 101 meq/L (ref 96–112)
Creatinine, Ser: 0.93 mg/dL (ref 0.40–1.20)
GFR: 60.65 mL/min (ref >60.00)
Glucose, Bld: 75 mg/dL (ref 70–99)
Potassium: 4.4 meq/L (ref 3.5–5.1)
Sodium: 137 meq/L (ref 135–145)
Total Bilirubin: 0.8 mg/dL (ref 0.2–1.2)
Total Protein: 6.8 g/dL (ref 6.0–8.3)

## 2024-06-13 LAB — LDL CHOLESTEROL, DIRECT: Direct LDL: 115 mg/dL

## 2024-06-13 MED ORDER — ESTRADIOL 0.5 MG PO TABS: 0.5000 mg | ORAL_TABLET | Freq: Every day | ORAL | 1 refills | Status: AC

## 2024-06-13 MED ORDER — PROPRANOLOL HCL 10 MG PO TABS
10.0000 mg | ORAL_TABLET | Freq: Three times a day (TID) | ORAL | 0 refills | Status: AC
Start: 2024-06-13 — End: ?

## 2024-06-13 NOTE — Progress Notes (Unsigned)
 Subjective:  Patient ID: Joanne Taylor, female    DOB: September 02, 1950  Age: 73 y.o. MRN: [id number]  CC: There were no encounter diagnoses.   HPI Joanne Taylor presents for  Chief Complaint  Patient presents with   Medical Management of Chronic Issues    6 month follow up     I have sluggish bowels  BMs are small caliber balls in the morning.  Gets a light pressure in the LLQ   and bloated in the lower abdomen , lots of gas with milk Last EGD 2001 , done during workup for anemia.    Outpatient Medications Prior to Visit  Medication Sig Dispense Refill   Calcium Carb-Cholecalciferol 1000-800 MG-UNIT TABS Take by mouth.     estradiol  (ESTRACE ) 0.5 MG tablet Take 1 tablet (0.5 mg total) by mouth daily. 90 tablet 1   fluticasone  (FLONASE ) 50 MCG/ACT nasal spray Place 2 sprays into both nostrils daily. 16 g 6   levothyroxine  (SYNTHROID ) 50 MCG tablet TAKE 1 TABLET BY MOUTH EVERY MORNING EXCEPT TAKE 2 TABLETS BY MOUTH ON SUNDAYS 100 tablet 1   Multiple Vitamin (MULTIVITAMIN) tablet Take 1 tablet by mouth daily.     Omega-3 Fatty Acids (FISH OIL) 1200 MG CAPS Take 1 capsule by mouth daily.     Probiotic Product (PROBIOTIC FORMULA PO) Take by mouth.     progesterone  (PROMETRIUM ) 100 MG capsule TAKE 1 CAPSULE BY MOUTH DAILY 90 capsule 1   propranolol  (INDERAL ) 10 MG tablet TAKE 1 TABLET BY MOUTH 3 TIMES A DAY FOR RAPID HEART RATE 30 tablet 0   vitamin E 180 MG (400 UNITS) capsule      No facility-administered medications prior to visit.    Review of Systems;  Patient denies headache, fevers, malaise, unintentional weight loss, skin rash, eye pain, sinus congestion and sinus pain, sore throat, dysphagia,  hemoptysis , cough, dyspnea, wheezing, chest pain, palpitations, orthopnea, edema, abdominal pain, nausea, melena, diarrhea, constipation, flank pain, dysuria, hematuria, urinary  Frequency, nocturia, numbness, tingling, seizures,  Focal weakness, Loss of consciousness,  Tremor,  insomnia, depression, anxiety, and suicidal ideation.      Objective:  BP (!) 118/54   Pulse 63   Ht 5' 8 (1.727 m)   Wt 178 lb 3.2 oz (80.8 kg)   SpO2 98%   BMI 27.10 kg/m   BP Readings from Last 3 Encounters:  06/13/24 (!) 118/54  01/18/24 90/62  12/12/23 114/72    Wt Readings from Last 3 Encounters:  06/13/24 178 lb 3.2 oz (80.8 kg)  01/18/24 175 lb 6 oz (79.5 kg)  12/12/23 177 lb (80.3 kg)    Physical Exam  Lab Results  Component Value Date   HGBA1C 5.6 11/11/2022   HGBA1C 5.8 11/09/2021    Lab Results  Component Value Date   CREATININE 0.93 12/12/2023   CREATININE 0.88 07/14/2023   CREATININE 1.02 06/13/2023    Lab Results  Component Value Date   WBC 7.6 08/13/2022   HGB 14.2 08/13/2022   HCT 41.8 08/13/2022   PLT 296.0 08/13/2022   GLUCOSE 97 12/12/2023   CHOL 197 12/12/2023   TRIG 126.0 12/12/2023   HDL 70.30 12/12/2023   LDLDIRECT 96.0 12/12/2023   LDLCALC 101 (H) 12/12/2023   ALT 12 12/12/2023   AST 17 12/12/2023   NA 138 12/12/2023   K 4.5 12/12/2023   CL 103 12/12/2023   CREATININE 0.93 12/12/2023   BUN 19 12/12/2023   CO2 29 12/12/2023  TSH 3.09 12/12/2023   HGBA1C 5.6 11/11/2022    MM 3D SCREENING MAMMOGRAM BILATERAL BREAST Result Date: 04/30/2024 CLINICAL DATA:  Screening. EXAM: DIGITAL SCREENING BILATERAL MAMMOGRAM WITH TOMOSYNTHESIS AND CAD TECHNIQUE: Bilateral screening digital craniocaudal and mediolateral oblique mammograms were obtained. Bilateral screening digital breast tomosynthesis was performed. The images were evaluated with computer-aided detection. COMPARISON:  Previous exam(s). ACR Breast Density Category b: There are scattered areas of fibroglandular density. FINDINGS: There are no findings suspicious for malignancy. IMPRESSION: No mammographic evidence of malignancy. A result letter of this screening mammogram will be mailed directly to the patient. RECOMMENDATION: Screening mammogram in one year. (Code:SM-B-01Y)  BI-RADS CATEGORY  1: Negative. Electronically Signed   By: Reyes Phi M.D.   On: 04/30/2024 08:29    Assessment & Plan:  .There are no diagnoses linked to this encounter.   I spent 34 minutes on the day of this face to face encounter reviewing patient's  most recent visit with cardiology,  nephrology,  and neurology,  prior relevant surgical and non surgical procedures, recent  labs and imaging studies, counseling on weight management,  reviewing the assessment and plan with patient, and post visit ordering and reviewing of  diagnostics and therapeutics with patient  .   Follow-up: No follow-ups on file.   Joanne LITTIE Kettering, MD

## 2024-06-13 NOTE — Patient Instructions (Signed)
 Try benefiber  with  prebiotics as a daily drink   Ok to add magnesium in small doses (mg citrate if you can find it )

## 2024-06-14 DIAGNOSIS — K5900 Constipation, unspecified: Secondary | ICD-10-CM | POA: Insufficient documentation

## 2024-06-14 LAB — TSH: TSH: 3.35 u[IU]/mL (ref 0.35–5.50)

## 2024-06-14 LAB — VITAMIN D 25 HYDROXY (VIT D DEFICIENCY, FRACTURES): VITD: 36.68 ng/mL (ref 30.00–100.00)

## 2024-06-14 NOTE — Assessment & Plan Note (Addendum)
 Recommend use of Benefiber and colace.  Add magnesium of needed . Last colonoscopy normal 2017   if constipation does not respond to fiber/stool softener,  will refer early for colonoscopy

## 2024-06-17 ENCOUNTER — Encounter: Payer: Self-pay | Admitting: Internal Medicine

## 2024-06-17 ENCOUNTER — Ambulatory Visit: Payer: Self-pay | Admitting: Internal Medicine

## 2024-06-17 DIAGNOSIS — N950 Postmenopausal bleeding: Secondary | ICD-10-CM

## 2024-06-20 ENCOUNTER — Encounter: Payer: Self-pay | Admitting: Internal Medicine

## 2024-06-20 DIAGNOSIS — N950 Postmenopausal bleeding: Secondary | ICD-10-CM

## 2024-06-20 NOTE — Telephone Encounter (Signed)
 Noted

## 2024-06-28 NOTE — Addendum Note (Signed)
 Addended by: MARYLYNN VERNEITA CROME on: 06/28/2024 02:40 PM   Modules accepted: Orders

## 2024-06-29 ENCOUNTER — Other Ambulatory Visit: Payer: Self-pay | Admitting: Internal Medicine

## 2024-07-02 DIAGNOSIS — N950 Postmenopausal bleeding: Secondary | ICD-10-CM | POA: Diagnosis not present

## 2024-07-02 DIAGNOSIS — Z124 Encounter for screening for malignant neoplasm of cervix: Secondary | ICD-10-CM | POA: Diagnosis not present

## 2024-07-02 DIAGNOSIS — N858 Other specified noninflammatory disorders of uterus: Secondary | ICD-10-CM | POA: Diagnosis not present

## 2024-07-03 ENCOUNTER — Ambulatory Visit

## 2024-07-23 ENCOUNTER — Encounter: Admitting: Obstetrics & Gynecology

## 2024-08-10 NOTE — H&P (Signed)
 " Joanne Taylor is a 74 y.o. female here for  D+C , H/S  and resection of endometrial polyp  . PMB started 6 weeks ago bright red , possible dark brown d/c oct 2025  . No pain . Rare sexual activity   Pt was on 0.5 mg estrace  and 100 mg Prometrium   No FHX for gyn cancer Last pap 10 yrs ago . Colonoscopy is up to date EMBX :  Part A-Endometrial Biopsy: ATROPHIC ENDOMETRIUM. NO EVIDENCE OF HYPERPLASIA, ENDOMETRITIS OR ATYPIA    U/s  SIS :  Uterus ======   Visualized. Size 42 mm x 41 mm x 31 mm Normal Position: anteverted Malformations: arcuate uterus Myometrium: appears normal Endometrium: normal, appears normal. Endometrial thickness, total 4.4 mm Cervix details: The cervical length is 2.4 cm. Cervical length 23.8 mm No fibroids identified Polyp(s)   Broad-based polyp. EMC. Homogeneous structure. Size 8 mm x 4 mm x 5 mm. Mean 5.7 mm. Doppler: color score 2 (minimal color)   Right Ovary =========   Visualized, Normal. Outline: Normal. Morphology: Appropriate. Size 21 mm x 15 mm x 16 mm No cysts identified Follicles identified   Right Tube =========   Not visualized   Left Ovary ========   Visualized, Normal. Outline: Normal. Morphology: Appropriate. Size 18 mm x 15 mm x 10 mm No cysts identified Follicles identified   Left Tube ========   Not visualized   Cul de Sac =========   Visualized. No free fluid visualized   Impression =========   Endovaginal ultrasound performed today due to the indications outlined above.   The sonogram reveals a anteverted uterus. Arcuate uterine cavity observed with 3D coronal rendering of the endometrial canal   The endometrium appears normal.   The ovaries were visualized bilaterally and appear normal.   There are no unusual adnexal findings.   There is no free fluid.   Saline ultrasound   No abnormal endometrial thickening, focal polyps or submucosal fibroids were appreciated. With intrauterine saline contrast in  place, echogenic polypoid mass visualized measuring 8 mm in length.   Past Medical History:  has a past medical history of Acquired hypothyroidism, Basal cell carcinoma (BCC) of right upper arm (11/07/2019), Diverticulosis (01/09/2016), Fatty liver disease, nonalcoholic, Hepatic steatosis, Osteopenia (05/29/2015), Paroxysmal supraventricular tachycardia (HHS-HCC), and Redundant colon (01/09/2016).  Past Surgical History:  has a past surgical history that includes Colonoscopy (01/09/2016); Laparoscopic tubal ligation; and Tubal ligation (1985). Family History: family history includes Anesthesia problems in her father; Cancer in her father; Colon cancer in her paternal grandmother; Heart disease in her mother; Heart failure in her mother; High blood pressure (Hypertension) in her brother and mother; Mental illness in her father; Osteoporosis (Thinning of bones) in her mother; Prostate cancer in her father; Thyroid  disease in her mother. Social History:  reports that she quit smoking about 53 years ago. Her smoking use included cigarettes. She started smoking about 55 years ago. She has a 2 pack-year smoking history. She has never used smokeless tobacco. She reports current alcohol use of about 3.0 standard drinks of alcohol per week. She reports that she does not use drugs. OB/GYN History:  OB History       Gravida  2   Para      Term      Preterm      AB      Living  2        SAB      IAB      Ectopic  Molar      Multiple      Live Births  2             Allergies: is allergic to cortisone and omeprazole . Medications:  Current Medications    Current Outpatient Medications:    calcium carbonate-vitamin D3 1,000 mg(2,500 mg)-800 unit Tab, Take by mouth., Disp: , Rfl:    L.ACID/L.CASEI/B.BIF/B.LON/FOS (PROBIOTIC BLEND ORAL), Take by mouth., Disp: , Rfl:    levothyroxine  (SYNTHROID ) 50 MCG tablet, Take 50 mcg by mouth once daily, Disp: , Rfl:    multivitamin tablet,  Take by mouth., Disp: , Rfl:    omega-3 fatty acids-fish oil 360-1,200 mg Cap, Take 1 capsule by mouth once daily, Disp: , Rfl:    polyethylene glycol (MIRALAX) powder, AS DIRECTED FOR COLONIC PREP., Disp: 255 g, Rfl: 0   propranoloL  (INDERAL ) 10 MG tablet, Take 10 mg by mouth, Disp: , Rfl:    vitamin E 400 UNIT capsule, , Disp: , Rfl:    estradiol  (ESTRACE ) 0.5 MG tablet, Take by mouth. (Patient not taking: Reported on 07/24/2024), Disp: , Rfl:    estradioL  (ESTRACE ) 0.5 MG tablet, Take 0.5 mg by mouth once daily (Patient not taking: Reported on 07/24/2024), Disp: , Rfl:    estrogen, conjugated,-medroxyprogesterone (PREMPRO) 0.3-1.5 mg tablet, Take by mouth. (Patient not taking: Reported on 11/22/2020), Disp: , Rfl:    estrogen, conjugated,-medroxyPROGESTERone (PREMPRO) 0.3-1.5 mg tablet, Take 1 tablet by mouth once daily (Patient not taking: Reported on 07/24/2024), Disp: , Rfl:    fluticasone  (FLONASE ) 50 mcg/actuation nasal spray, Place 2 sprays into both nostrils once daily. (Patient not taking: Reported on 07/24/2024), Disp: , Rfl:    fluticasone  propionate (FLONASE ) 50 mcg/actuation nasal spray, Place 2 sprays into both nostrils once daily (Patient not taking: Reported on 07/24/2024), Disp: , Rfl:    progesterone  (PROMETRIUM ) 100 MG capsule, Take by mouth. (Patient not taking: Reported on 07/24/2024), Disp: , Rfl:    progesterone  (PROMETRIUM ) 100 MG capsule, Take 100 mg by mouth once daily (Patient not taking: Reported on 07/24/2024), Disp: , Rfl:      Review of Systems: General:                      No fatigue or weight loss Eyes:                           No vision changes Ears:                            No hearing difficulty Respiratory:                No cough or shortness of breath Pulmonary:                  No asthma or shortness of breath Cardiovascular:           No chest pain, palpitations, dyspnea on exertion Gastrointestinal:          No abdominal bloating, chronic diarrhea,  constipations, masses, pain or hematochezia Genitourinary:             No hematuria, dysuria, abnormal vaginal discharge, pelvic pain, Menometrorrhagia Lymphatic:                   No swollen lymph nodes Musculoskeletal:No muscle weakness Neurologic:  No extremity weakness, syncope, seizure disorder Psychiatric:                  No history of depression, delusions or suicidal/homicidal ideation      Exam:       Vitals:    08/13/24  1447  BP: 130/75  Pulse: 101      Body mass index is 27.67 kg/m.   WDWN white/ female in NAD   Lungs: CTA  CV : RRR without murmur   Pelvic: tanner stage 5 ,  External genitalia: vulva /labia no lesions Urethra: no prolapse Vagina: normal physiologic d/c Cervix: no lesions, no cervical motion tenderness   Uterus: normal size shape and contour, non-tender Adnexa: no mass,  non-tender   Rectovaginal:  SIS as above  Pelvic exam done Chaperone present   Impression:    The primary encounter diagnosis was Post-menopausal bleeding. A diagnosis of Endometrial polyp was also pertinent to this visit.       Plan:  Spoke to the patient about the finding and I recommend a D+C , H/S and resection of the polyp with myosure .    Benefits and risks to surgery: The proposed benefit of the surgery has been discussed with the patient. Risks of possible uterine perforation  The possible risks include, but are not limited to: organ injury to the bowel , bladder, ureters, and major blood vessels and nerves. There is a possibility of additional surgeries resulting from these injuries. There is also the risk of blood transfusion and the need to receive blood products during or after the procedure which may rarely lead to HIV or Hepatitis C infection. There is a risk of developing a deep venous thrombosis or a pulmonary embolism . There is the possibility of wound infection and also anesthetic complications, even the rare possibility of death. The patient  understands these risks and wishes to proceed. All questions have been answered and the consent has been signed.               Joanne CLARYCE DINSMORE, MD     "

## 2024-08-17 ENCOUNTER — Encounter
Admission: RE | Admit: 2024-08-17 | Discharge: 2024-08-17 | Disposition: A | Source: Ambulatory Visit | Attending: Obstetrics and Gynecology | Admitting: Obstetrics and Gynecology

## 2024-08-17 ENCOUNTER — Other Ambulatory Visit: Payer: Self-pay

## 2024-08-17 DIAGNOSIS — Z01818 Encounter for other preprocedural examination: Secondary | ICD-10-CM | POA: Insufficient documentation

## 2024-08-17 HISTORY — DX: Hypothyroidism, unspecified: E03.9

## 2024-08-17 HISTORY — DX: Other specified congenital malformations of intestine: Q43.8

## 2024-08-17 HISTORY — DX: Supraventricular tachycardia, unspecified: I47.10

## 2024-08-17 HISTORY — DX: Fatty (change of) liver, not elsewhere classified: K76.0

## 2024-08-17 HISTORY — DX: Diverticulosis of intestine, part unspecified, without perforation or abscess without bleeding: K57.90

## 2024-08-17 NOTE — Progress Notes (Signed)
 Last cardiology note:   Gerard Frederick, NP  Nurse Practitioner Cardiology   Progress Notes    Signed   Encounter Date: 01/18/2024   Signed       Cardiology Office Note    Date:  01/18/2024  ID:  Joanne Taylor, Joanne Taylor 1951-07-22, MRN [id number] PCP: Marylynn Verneita CROME, MD  [facility] Providers Cardiologist:  Deatrice Cage, MD      History of Present Illness Joanne Taylor is a 74 y.o. female with a past medical history of paroxysmal supraventricular tachycardia, hypothyroidism, fatty liver disease, who presents today for follow-up.   She was last seen in clinic 10/05/2022 by Dr.Arida.  She had been referred by Dr. Mliss for evaluation of her paroxysmal SVT.  She had no prior cardiac history and overall was very active with no chest pain or shortness of breath.  She stated she had recently been attending a reunion and had wanted to drink.  Her smart watch alerted her tachycardia with heart rate averaged 126 bpm.  She was overall asymptomatic but the tachycardia continued for a few hours.  She denies any other associated symptoms of dizziness, syncope or near syncope.  She had recurrent episodes when she had alcohol again.  She cut down alcohol intake.  Symptoms improved.  She was scheduled for a monitor for 2 weeks.   She returns to clinic today stating overall from a cardiac perspective she has been doing well.  She has had approximately 5 episodes of elevated heart rate over the past year but was taken as needed propranolol  approximately 1 time.  She denies any chest pain, shortness of breath, peripheral edema.  States that she has been compliant with her current medication regimen without any undue side effects.  She denies any hospitalizations or visits to the emergency department.   ROS: 10 point review of system has been reviewed and considered negative except what is listed in the HPI   Studies Reviewed EKG Interpretation Date/Time:                  Wednesday January 18 2024 14:55:23 EDT Ventricular Rate:         75 PR Interval:                 150 QRS Duration:             82 QT Interval:                 376 QTC Calculation:419 R Axis:                         0   Text Interpretation:Normal sinus rhythm Normal ECG When compared with ECG of 27-Sep-2015 11:11, No significant change was found Confirmed by Gerard Frederick ([id number]) on 01/18/2024 2:58:43 PM     Event Monitor (Zio) 10/28/2022 Patient had a min HR of 56 bpm, max HR of 203 bpm, and avg HR of 83 bpm. Predominant underlying rhythm was Sinus Rhythm.  13 Supraventricular Tachycardia runs occurred, the run with the fastest interval lasting 4 beats with a max rate of 203 bpm, the  longest lasting 17 beats with an avg rate of 150 bpm.  Rare PACs and rare PVCs.   Risk Assessment/Calculations         Physical Exam VS:  BP 90/62 (BP Location: Left Arm, Patient Position: Sitting, Cuff Size: Normal)   Pulse 75   Ht 5' 8 (1.727 m)  Wt 175 lb 6 oz (79.5 kg)   SpO2 98%   BMI 26.67 kg/m       Wt Readings from Last 3 Encounters:  01/18/24 175 lb 6 oz (79.5 kg)  12/12/23 177 lb (80.3 kg)  11/08/23 174 lb (78.9 kg)    GEN: Well nourished, well developed in no acute distress NECK: No JVD; No carotid bruits CARDIAC: RRR, no murmurs, rubs, gallops RESPIRATORY:  Clear to auscultation without rales, wheezing or rhonchi  ABDOMEN: Soft, non-tender, non-distended EXTREMITIES:  No edema; No deformity    ASSESSMENT AND PLAN Intermittent tachycardia/paroxysmal supraventricular tachycardia/palpitations which she has had recurrence approximately 5 times in the last year and it is only taking her as needed propranolol  once.  Previous ZIO monitor worn revealed an average heart rate of 83 bpm with an underlying rhythm of sinus, 13 supraventricular tachycardia runs occurred lasting 17 beats with the longest interval, rare PACs and rare PVCs.  She states that if she has issues they are typically when she lies down in  the evening, when she has alcohol, or when she has been working in the yard.  We have discussed today triggers which are stress, fatigue, caffeine, dehydration, and alcohol.  She has been advised to notify us  if she has any reoccurrence that happens more frequently or is more intense or if she starts taking her as needed propranolol  on a more regular basis at that time her medication regimen may need to be changed to something daily.   LVH noted on EKG with a rate of 75 with continued LVH with no acute changes from prior studies.  Will continue to monitor with surveillance studies.  She has no history of hypertension and her cardiac exam is unremarkable.  If there are any changes or increased palpitations symptoms of shortness of breath we will obtain an echocardiogram at that time.       Dispo: Patient return to clinic to see MD/APP in 11 to 12 months or sooner if needed   Signed, SHERI HAMMOCK, NP           Electronically signed by Gerard Frederick, NP at 01/18/2024  3:50 PM     Office Visit on 01/18/2024       Detailed Report      Note shared with patient Additional Documentation  Vitals: BP 90/62 (BP Location: Left Arm, Patient Position: Sitting, Cuff Size: Normal)   Pulse 75   Ht 5' 8 (1.727 m)   Wt 79.5 kg   SpO2 98%   BMI 26.67 kg/m   BSA 1.95 m      More Vitals  Flowsheets: Anthropometrics,   MEWS Score,   NEWS,   Vital Signs,   Data  Encounter Info: Billing Info,   History,   Allergies,   Detailed Report

## 2024-08-17 NOTE — Patient Instructions (Addendum)
 Your procedure is scheduled on: THURSDAY 08/23/24 Report to the Registration Desk on the 1st floor of the [facility]. To find out your arrival time, please call [phone] between 1PM - 3PM on: [facility] 08/22/24 If your arrival time is 6:00 am, do not arrive before that time as the [facility] entrance doors do not open until 6:00 am.  REMEMBER: Instructions that are not followed completely may result in serious medical risk, up to and including death; or upon the discretion of your surgeon and anesthesiologist your surgery may need to be rescheduled.  Do not eat food after midnight the night before surgery.  No gum chewing or hard candies.  You may however, drink CLEAR liquids up to 2 hours before you are scheduled to arrive for your surgery. Do not drink anything within 2 hours of your scheduled arrival time.  Clear liquids include: - water  - apple juice without pulp - gatorade (not RED colors) - black coffee or tea (Do NOT add milk or creamers to the coffee or tea) Do NOT drink anything that is not on this list.  One week prior to surgery: Stop Anti-inflammatories (NSAIDS) such as Advil, Aleve, Ibuprofen, Motrin, Naproxen, Naprosyn and Aspirin based products such as Excedrin, Goody's Powder, BC Powder. Stop ANY OVER THE COUNTER supplements until after surgery.  You may however, continue to take Tylenol  if needed for pain up until the day of surgery.  Continue taking all of your other prescription medications up until the day of surgery.  ON THE DAY OF SURGERY ONLY TAKE THESE MEDICATIONS WITH SIPS OF WATER:  levothyroxine  (SYNTHROID )   Use inhalers on the day of surgery and bring to the hospital.  No Alcohol for 24 hours before or after surgery.  No Smoking including e-cigarettes for 24 hours before surgery.  No chewable tobacco products for at least 6 hours before surgery.  No nicotine patches on the day of surgery.  Do not use any recreational drugs for at least a  week (preferably 2 weeks) before your surgery.  Please be advised that the combination of cocaine and anesthesia may have negative outcomes, up to and including death. If you test positive for cocaine, your surgery will be cancelled.  On the morning of surgery brush your teeth with toothpaste and water, you may rinse your mouth with mouthwash if you wish. Do not swallow any toothpaste or mouthwash.  Use CHG Soap or wipes as directed on instruction sheet.  Do not wear jewelry, make-up, hairpins, clips or nail polish.  For welded (permanent) jewelry: bracelets, anklets, waist bands, etc.  Please have this removed prior to surgery.  If it is not removed, there is a chance that hospital personnel will need to cut it off on the day of surgery.  Do not wear lotions, powders, or perfumes.   Do not shave body hair from the neck down 48 hours before surgery.  Contact lenses, hearing aids and dentures may not be worn into surgery.  Do not bring valuables to the hospital. [facility] is not responsible for any missing/lost belongings or valuables.   Bring your C-PAP to the hospital in case you may have to spend the night.   Notify your doctor if there is any change in your medical condition (cold, fever, infection).  Wear comfortable clothing (specific to your surgery type) to the hospital.  After surgery, you can help prevent lung complications by doing breathing exercises.  Take deep breaths and cough every 1-2 hours. Your  doctor may order a device called an Incentive Spirometer to help you take deep breaths. When coughing or sneezing, hold a pillow firmly against your incision with both hands. This is called splinting. Doing this helps protect your incision. It also decreases belly discomfort.  If you are being discharged the day of surgery, you will not be allowed to drive home. You will need a responsible individual to drive you home and stay with you for 24 hours after surgery.   If  you are taking public transportation, you will need to have a responsible individual with you.  Please call the Pre-admissions Testing Dept. at [phone] if you have any questions about these instructions.  Surgery Visitation Policy:  Patients having surgery or a procedure may have two visitors.  Children under the age of 5 must have an adult with them who is not the patient.  Merchandiser, Retail to address health-related social needs:  [email or web address]

## 2024-08-21 ENCOUNTER — Inpatient Hospital Stay
Admission: RE | Admit: 2024-08-21 | Discharge: 2024-08-21 | Attending: Obstetrics and Gynecology | Admitting: Obstetrics and Gynecology

## 2024-08-21 DIAGNOSIS — R5383 Other fatigue: Secondary | ICD-10-CM | POA: Diagnosis not present

## 2024-08-21 DIAGNOSIS — Z01812 Encounter for preprocedural laboratory examination: Secondary | ICD-10-CM | POA: Insufficient documentation

## 2024-08-21 DIAGNOSIS — Z01818 Encounter for other preprocedural examination: Secondary | ICD-10-CM

## 2024-08-21 LAB — BASIC METABOLIC PANEL WITH GFR
Anion gap: 12 (ref 5–15)
BUN: 16 mg/dL (ref 8–23)
CO2: 23 mmol/L (ref 22–32)
Calcium: 9.2 mg/dL (ref 8.9–10.3)
Chloride: 102 mmol/L (ref 98–111)
Creatinine, Ser: 0.9 mg/dL (ref 0.44–1.00)
GFR, Estimated: >60 mL/min
Glucose, Bld: 93 mg/dL (ref 70–99)
Potassium: 4.1 mmol/L (ref 3.5–5.1)
Sodium: 137 mmol/L (ref 135–145)

## 2024-08-21 LAB — CBC
HCT: 43.9 % (ref 36.0–46.0)
Hemoglobin: 14.5 g/dL (ref 12.0–15.0)
MCH: 29.7 pg (ref 26.0–34.0)
MCHC: 33 g/dL (ref 30.0–36.0)
MCV: 89.8 fL (ref 80.0–100.0)
Platelets: 290 K/uL (ref 150–400)
RBC: 4.89 MIL/uL (ref 3.87–5.11)
RDW: 13.3 % (ref 11.5–15.5)
WBC: 5.9 K/uL (ref 4.0–10.5)
nRBC: 0 % (ref 0.0–0.2)

## 2024-08-21 LAB — TYPE AND SCREEN
ABO/RH(D): A POS
Antibody Screen: NEGATIVE

## 2024-08-23 ENCOUNTER — Other Ambulatory Visit: Payer: Self-pay

## 2024-08-23 ENCOUNTER — Ambulatory Visit: Admitting: Anesthesiology

## 2024-08-23 ENCOUNTER — Encounter: Payer: Self-pay | Admitting: Obstetrics and Gynecology

## 2024-08-23 ENCOUNTER — Encounter: Admission: RE | Disposition: A | Payer: Self-pay | Source: Home / Self Care | Attending: Obstetrics and Gynecology

## 2024-08-23 ENCOUNTER — Ambulatory Visit
Admission: RE | Admit: 2024-08-23 | Discharge: 2024-08-23 | Disposition: A | Discharge status: Home / Self Care | Attending: Obstetrics and Gynecology | Admitting: Obstetrics and Gynecology

## 2024-08-23 DIAGNOSIS — E039 Hypothyroidism, unspecified: Secondary | ICD-10-CM | POA: Insufficient documentation

## 2024-08-23 DIAGNOSIS — Z87891 Personal history of nicotine dependence: Secondary | ICD-10-CM | POA: Insufficient documentation

## 2024-08-23 DIAGNOSIS — N840 Polyp of corpus uteri: Secondary | ICD-10-CM | POA: Insufficient documentation

## 2024-08-23 DIAGNOSIS — Z01818 Encounter for other preprocedural examination: Secondary | ICD-10-CM

## 2024-08-23 DIAGNOSIS — N950 Postmenopausal bleeding: Secondary | ICD-10-CM | POA: Diagnosis present

## 2024-08-23 DIAGNOSIS — I471 Supraventricular tachycardia, unspecified: Secondary | ICD-10-CM | POA: Diagnosis not present

## 2024-08-23 HISTORY — PX: DILATATION & CURRETTAGE/HYSTEROSCOPY WITH RESECTOCOPE: SHX5572

## 2024-08-23 HISTORY — PX: MYOSURE RESECTION: SHX7611

## 2024-08-23 LAB — ABO/RH: ABO/RH(D): A POS

## 2024-08-23 MED ORDER — CEFAZOLIN SODIUM-DEXTROSE 2-4 GM/100ML-% IV SOLN
2.0000 g | Freq: Once | INTRAVENOUS | Status: AC
  Administered 2024-08-23: 2 g via INTRAVENOUS

## 2024-08-23 MED ORDER — CHLORHEXIDINE GLUCONATE 0.12 % MT SOLN
OROMUCOSAL | Status: AC
  Filled 2024-08-23: qty 15

## 2024-08-23 MED ORDER — ONDANSETRON HCL 4 MG/2ML IJ SOLN: 4.0000 mg | Freq: Once | INTRAMUSCULAR | Status: DC | PRN

## 2024-08-23 MED ORDER — OXYCODONE HCL 5 MG PO TABS
5.0000 mg | ORAL_TABLET | Freq: Once | ORAL | Status: AC | PRN
  Administered 2024-08-23: 5 mg via ORAL

## 2024-08-23 MED ORDER — ACETAMINOPHEN 10 MG/ML IV SOLN
INTRAVENOUS | Status: DC | PRN
  Administered 2024-08-23: 1000 mg via INTRAVENOUS

## 2024-08-23 MED ORDER — CEFAZOLIN SODIUM-DEXTROSE 2-4 GM/100ML-% IV SOLN
INTRAVENOUS | Status: AC
  Filled 2024-08-23: qty 100

## 2024-08-23 MED ORDER — LACTATED RINGERS IV SOLN: INTRAVENOUS | Status: DC

## 2024-08-23 MED ORDER — ACETAMINOPHEN 10 MG/ML IV SOLN
INTRAVENOUS | Status: AC
  Filled 2024-08-23: qty 100

## 2024-08-23 MED ORDER — POVIDONE-IODINE 10 % EX SWAB: 2.0000 | Freq: Once | CUTANEOUS | Status: DC

## 2024-08-23 MED ORDER — OXYCODONE HCL 5 MG PO TABS
ORAL_TABLET | ORAL | Status: AC
  Filled 2024-08-23: qty 1

## 2024-08-23 MED ORDER — CHLORHEXIDINE GLUCONATE 0.12 % MT SOLN
15.0000 mL | Freq: Once | OROMUCOSAL | Status: AC
  Administered 2024-08-23: 15 mL via OROMUCOSAL

## 2024-08-23 MED ORDER — FENTANYL CITRATE (PF) 100 MCG/2ML IJ SOLN
INTRAMUSCULAR | Status: AC
  Filled 2024-08-23: qty 2

## 2024-08-23 MED ORDER — PROPOFOL 1000 MG/100ML IV EMUL
INTRAVENOUS | Status: AC
  Filled 2024-08-23: qty 100

## 2024-08-23 MED ORDER — ORAL CARE MOUTH RINSE: 15.0000 mL | Freq: Once | OROMUCOSAL | Status: AC

## 2024-08-23 MED ORDER — DEXAMETHASONE SOD PHOSPHATE PF 10 MG/ML IJ SOLN
INTRAMUSCULAR | Status: AC
  Filled 2024-08-23: qty 1

## 2024-08-23 MED ORDER — PROPOFOL 10 MG/ML IV BOLUS
INTRAVENOUS | Status: DC | PRN
  Administered 2024-08-23: 10 mg via INTRAVENOUS
  Administered 2024-08-23: 30 mg via INTRAVENOUS
  Administered 2024-08-23: 20 mg via INTRAVENOUS
  Administered 2024-08-23: 30 mg via INTRAVENOUS
  Administered 2024-08-23: 20 mg via INTRAVENOUS
  Administered 2024-08-23: 50 mg via INTRAVENOUS

## 2024-08-23 MED ORDER — PROPOFOL 10 MG/ML IV BOLUS
INTRAVENOUS | Status: AC
  Filled 2024-08-23: qty 20

## 2024-08-23 MED ORDER — FENTANYL CITRATE (PF) 100 MCG/2ML IJ SOLN: 25.0000 ug | INTRAMUSCULAR | Status: DC | PRN

## 2024-08-23 MED ORDER — FENTANYL CITRATE (PF) 100 MCG/2ML IJ SOLN
INTRAMUSCULAR | Status: DC | PRN
  Administered 2024-08-23: 50 ug via INTRAVENOUS
  Administered 2024-08-23 (×2): 25 ug via INTRAVENOUS

## 2024-08-23 MED ORDER — ONDANSETRON HCL 4 MG/2ML IJ SOLN
INTRAMUSCULAR | Status: AC
  Filled 2024-08-23: qty 2

## 2024-08-23 MED ORDER — PROPOFOL 500 MG/50ML IV EMUL
INTRAVENOUS | Status: DC | PRN
  Administered 2024-08-23: 120 ug/kg/min via INTRAVENOUS

## 2024-08-23 MED ORDER — LIDOCAINE HCL (PF) 2 % IJ SOLN
INTRAMUSCULAR | Status: AC
  Filled 2024-08-23: qty 5

## 2024-08-23 MED ORDER — ONDANSETRON HCL 4 MG/2ML IJ SOLN
INTRAMUSCULAR | Status: DC | PRN
  Administered 2024-08-23: 4 mg via INTRAVENOUS

## 2024-08-23 MED ORDER — SILVER NITRATE-POT NITRATE 75-25 % EX MISC
CUTANEOUS | Status: AC
  Filled 2024-08-23: qty 10

## 2024-08-23 MED ORDER — OXYCODONE HCL 5 MG/5ML PO SOLN: 5.0000 mg | Freq: Once | ORAL | Status: AC | PRN

## 2024-08-23 NOTE — Brief Op Note (Signed)
 08/23/2024  7:30 AM  8:22 AM  PATIENT:  Joanne Taylor  74 y.o. female  PRE-OPERATIVE DIAGNOSIS:  postmenopausal bleeding endometrial polyp  POST-OPERATIVE DIAGNOSIS:  postmenopausal bleeding  endometrial polyp  PROCEDURE:  Procedures with comments: DILATATION & CURETTAGE/HYSTEROSCOPY WITH RESECTOCOPE (N/A) MYOSURE RESECTION (N/A) - Myosure resection of polyp  SURGEON:  Surgeons and Role:    * Ireoluwa Gorsline, Debby PARAS, MD - Primary  PHYSICIAN ASSISTANT: cst  ASSISTANTS: none   ANESTHESIA:   MAC  EBL:  5 mL iof 300  uo 50  net deficit   BLOOD ADMINISTERED:none  DRAINS: none   LOCAL MEDICATIONS USED:  NONE  SPECIMEN:  Source of Specimen:  ecc , endometrial curettings with endometrial polyp  DISPOSITION OF SPECIMEN:  PATHOLOGY  COUNTS:  YES  TOURNIQUET:  * No tourniquets in log *  DICTATION: .Other Dictation: Dictation Number verbal  PLAN OF CARE: Discharge to home after PACU  PATIENT DISPOSITION:  PACU - hemodynamically stable.   Delay start of Pharmacological VTE agent (>24hrs) due to surgical blood loss or risk of bleeding: not applicable

## 2024-08-23 NOTE — Progress Notes (Signed)
 Here for D+C , H/S and resection of endometrial polyp. Labs reviewed . All questions answered . Proceed

## 2024-08-23 NOTE — Op Note (Signed)
 NAME: Joanne Taylor, BARCOMB MEDICAL RECORD NO: [id number] ACCOUNT NO: [id number] DATE OF BIRTH: 01/13/1951 FACILITY: [facility] LOCATION: [facility] PHYSICIAN: Debby DOROTHA Dinsmore, MD  Operative Report   DATE OF PROCEDURE: 08/23/2024  PREOPERATIVE DIAGNOSES: 1.  Postmenopausal bleeding 2.  Endometrial polyp.  POSTOPERATIVE DIAGNOSES: 1.  Postmenopausal bleeding 2.  Endometrial polyp.  PROCEDURE: 1.  Fractional dilation and curettage. 2.  Hysteroscopic resection of endometrial polyp with NovaSure.  SURGEON:  Debby DOROTHA Dinsmore, MD  ANESTHESIA:  MAC.  INDICATIONS:  A 74 year old female with 1 episode of heavy vaginal bleeding.  Workup in the office demonstrated a endometrial polyp.  Endometrial biopsy in the clinic was benign.  DESCRIPTION OF PROCEDURE:  After adequate MAC anesthesia, the patient was placed in dorsal supine position with the legs in the candy cane stirrups.  The patient received 2 grams IV Ancef .  The patient's abdomen and vagina were prepped and draped normal  sterile fashion.  Timeout was performed.  A weighted speculum was placed in the posterior vaginal vault and the bladder was drained with a red Robinson catheter yielding 50 mL clear urine.  Anterior cervix grasped with a single-tooth tenaculum and  endocervical curettage was performed with scant tissue.  Cervix was then dilated to #15 Hanks dilator and the hysteroscope with normal saline as the distending medium was advanced into the endometrial cavity.  An anterior wall pedunculated polyp was  noted.  The MyoSure light was then brought up to the operative field and polyp was dissected to its base.  The hysteroscope was removed and the cervix was then dilated with #20 Hanks dilator and a sharp curettage was performed with scant endometrial  curettings.  Good hemostasis was noted.  The procedure was terminated.  There were no complications.  ESTIMATED BLOOD LOSS:  Minimal.  INTRAOPERATIVE FLUIDS:  300  mL.  URINE OUTPUT:  50 mL.  NET DEFICIT NORMAL SALINE:  100 mL.  DISPOSITION:  The patient was taken to recovery room in good condition.   NIK D: 08/23/2024 9:02:51 am T: 08/23/2024 9:16:00 am  JOB: [id number]/ [id number]

## 2024-08-23 NOTE — Transfer of Care (Signed)
 Immediate Anesthesia Transfer of Care Note  Patient: Joanne Taylor  Procedure(s) Performed: DILATATION & CURETTAGE/HYSTEROSCOPY WITH RESECTOCOPE (Uterus) MYOSURE RESECTION (Uterus)  Patient Location: PACU  Anesthesia Type:MAC  Level of Consciousness: awake, alert , and oriented  Airway & Oxygen Therapy: Patient Spontanous Breathing  Post-op Assessment: Report given to RN and Post -op Vital signs reviewed and stable  Post vital signs: Reviewed and stable  Last Vitals:  Vitals Value Taken Time  BP 127/77 08/23/24 08:31  Temp 97.5   Pulse 78 08/23/24 08:33  Resp 10 08/23/24 08:33  SpO2 95 % 08/23/24 08:33  Vitals shown include unfiled device data.  Last Pain:  Vitals:   08/23/24 0619  TempSrc: Temporal  PainSc: 0-No pain         Complications: No notable events documented.

## 2024-08-23 NOTE — Anesthesia Preprocedure Evaluation (Addendum)
"                                    Anesthesia Evaluation  Patient identified by MRN, date of birth, ID band Patient awake    Reviewed: Allergy & Precautions, NPO status , Patient's Chart, lab work & pertinent test results  Airway Mallampati: III  TM Distance: >3 FB Neck ROM: Full    Dental  (+) Teeth Intact   Pulmonary Patient abstained from smoking., former smoker   Pulmonary exam normal        Cardiovascular Exercise Tolerance: Good + dysrhythmias Supra Ventricular Tachycardia  Rhythm:Regular     Neuro/Psych  Headaches negative neurological ROS  negative psych ROS   GI/Hepatic negative GI ROS, Neg liver ROS,,,  Endo/Other  negative endocrine ROSHypothyroidism    Renal/GU negative Renal ROS  negative genitourinary   Musculoskeletal negative musculoskeletal ROS (+)    Abdominal Normal abdominal exam  (+)   Peds negative pediatric ROS (+)  Hematology negative hematology ROS (+)   Anesthesia Other Findings Past Medical History: Always: Allergy 11/07/2019: Basal cell carcinoma (BCC) of right upper arm No date: Diverticulosis No date: Fatty liver disease, nonalcoholic No date: Hypothyroidism No date: Osteopenia No date: Osteopenia No date: Paroxysmal supraventricular tachycardia No date: Redundant colon No date: Thyroid  disease  Past Surgical History: No date: COLONOSCOPY 01/09/2016: COLONOSCOPY WITH PROPOFOL ; N/A     Comment:  Procedure: COLONOSCOPY WITH PROPOFOL ;  Surgeon: Gladis RAYMOND Mariner, MD;  Location: [facility] ENDOSCOPY;  Service:               Endoscopy;  Laterality: N/A; No date: TUBAL LIGATION     Reproductive/Obstetrics negative OB ROS                              Anesthesia Physical Anesthesia Plan  ASA: 2  Anesthesia Plan: General   Post-op Pain Management:    Induction: Intravenous  PONV Risk Score and Plan: Propofol  infusion and TIVA  Airway Management Planned: Natural Airway and  Nasal Cannula  Additional Equipment:   Intra-op Plan:   Post-operative Plan:   Informed Consent: I have reviewed the patients History and Physical, chart, labs and discussed the procedure including the risks, benefits and alternatives for the proposed anesthesia with the patient or authorized representative who has indicated his/her understanding and acceptance.     Dental Advisory Given  Plan Discussed with: CRNA  Anesthesia Plan Comments:          Anesthesia Quick Evaluation  "

## 2024-08-23 NOTE — Anesthesia Postprocedure Evaluation (Signed)
"   Anesthesia Post Note  Patient: Joanne Taylor  Procedure(s) Performed: DILATATION & CURETTAGE/HYSTEROSCOPY WITH RESECTOCOPE (Uterus) MYOSURE RESECTION (Uterus)  Patient location during evaluation: PACU Anesthesia Type: General Level of consciousness: awake Pain management: satisfactory to patient Vital Signs Assessment: post-procedure vital signs reviewed and stable Respiratory status: spontaneous breathing Cardiovascular status: stable Anesthetic complications: no   No notable events documented.   Last Vitals:  Vitals:   08/23/24 0900 08/23/24 0924  BP: (!) 152/81 (!) 163/78  Pulse: (!) 58 (!) 57  Resp: 16 18  Temp:  36.7 C  SpO2: 96% 97%    Last Pain:  Vitals:   08/23/24 0852  TempSrc:   PainSc: 4                  VAN STAVEREN,Rosealynn Mateus      "

## 2024-08-24 ENCOUNTER — Encounter: Payer: Self-pay | Admitting: Obstetrics and Gynecology

## 2024-08-24 LAB — SURGICAL PATHOLOGY

## 2024-11-12 ENCOUNTER — Ambulatory Visit

## 2024-12-12 ENCOUNTER — Encounter: Admitting: Internal Medicine
